# Patient Record
Sex: Male | Born: 1955 | Race: Black or African American | Hispanic: No | Marital: Married | Smoking: Current every day smoker
Health system: Southern US, Community
[De-identification: ages and names within clinical notes are randomized; demographics above are authoritative.]

## PROBLEM LIST (undated history)

## (undated) ENCOUNTER — Ambulatory Visit

## (undated) ENCOUNTER — Ambulatory Visit: Payer: MEDICARE | Attending: Family | Primary: Family

## (undated) ENCOUNTER — Telehealth

## (undated) ENCOUNTER — Encounter: Attending: Family | Primary: Family

## (undated) ENCOUNTER — Encounter

## (undated) ENCOUNTER — Ambulatory Visit: Payer: MEDICARE

## (undated) ENCOUNTER — Telehealth: Attending: Family | Primary: Family

## (undated) ENCOUNTER — Telehealth
Attending: Pharmacist Clinician (PhC)/ Clinical Pharmacy Specialist | Primary: Pharmacist Clinician (PhC)/ Clinical Pharmacy Specialist

## (undated) ENCOUNTER — Ambulatory Visit: Payer: MEDICARE | Attending: Gastroenterology | Primary: Gastroenterology

## (undated) ENCOUNTER — Encounter: Attending: Ophthalmology | Primary: Ophthalmology

## (undated) ENCOUNTER — Ambulatory Visit
Attending: Pharmacist Clinician (PhC)/ Clinical Pharmacy Specialist | Primary: Pharmacist Clinician (PhC)/ Clinical Pharmacy Specialist

## (undated) ENCOUNTER — Encounter: Attending: Gastroenterology | Primary: Gastroenterology

## (undated) ENCOUNTER — Encounter
Attending: Pharmacist Clinician (PhC)/ Clinical Pharmacy Specialist | Primary: Pharmacist Clinician (PhC)/ Clinical Pharmacy Specialist

## (undated) ENCOUNTER — Ambulatory Visit: Payer: MEDICARE | Attending: Ophthalmology | Primary: Ophthalmology

---
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
## 2007-09-05 ENCOUNTER — Emergency Department: Payer: Self-pay | Admitting: Emergency Medicine

## 2011-01-02 ENCOUNTER — Emergency Department: Payer: Self-pay | Admitting: Emergency Medicine

## 2011-12-31 IMAGING — CT CT HEAD WITHOUT CONTRAST
2 series · 16 of 30 positions shown, 20 images · non-contrast
Comparison: none

REASON FOR EXAM: weakness
COMMENTS:

[Series 2: without · axial · non-contrast · 0.39mm/px · z∈[+903,+1028]mm · 13 of 31 slices shown, 17 images]
[im 3/31  brain]
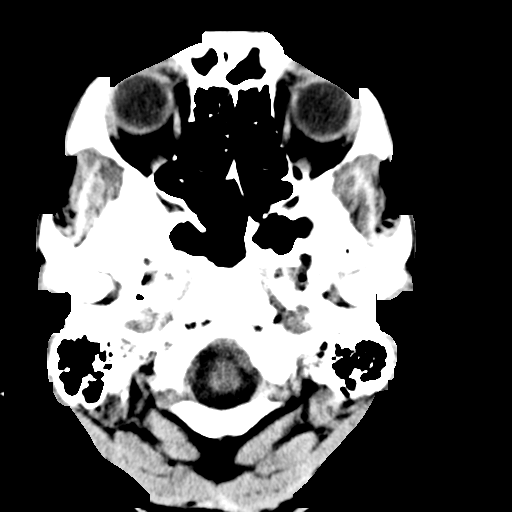
[im 3/31  bone]
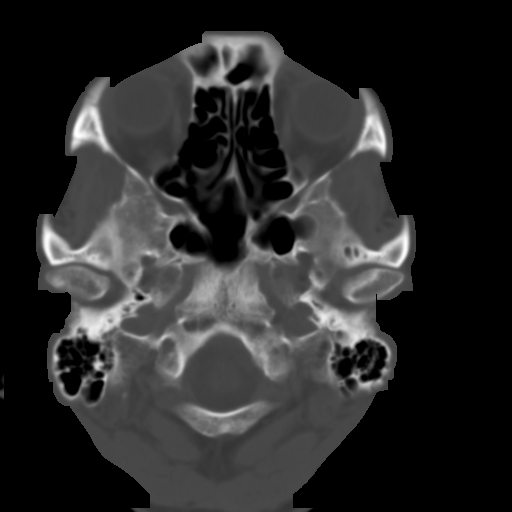
[im 5/31  brain]
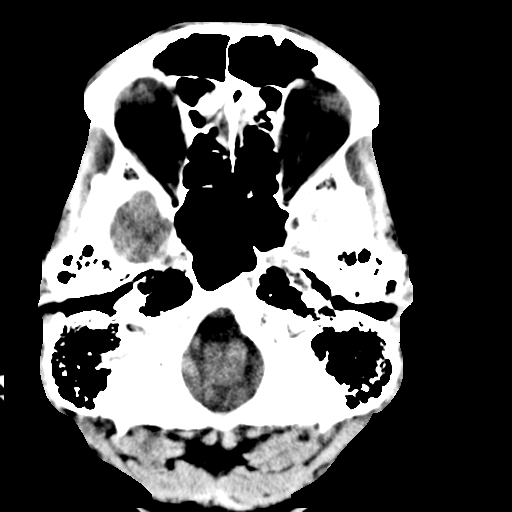
[im 7/31  brain]
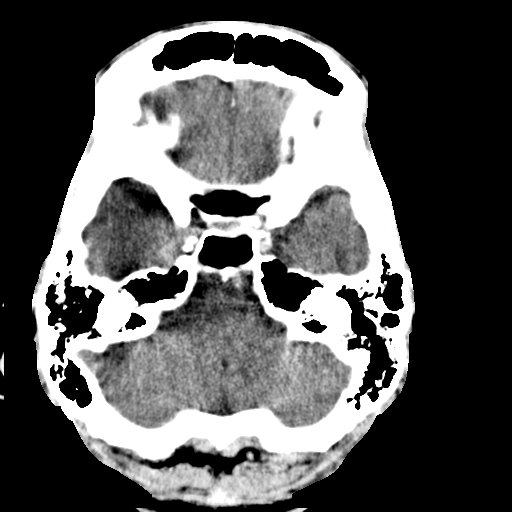
[im 9/31  brain]
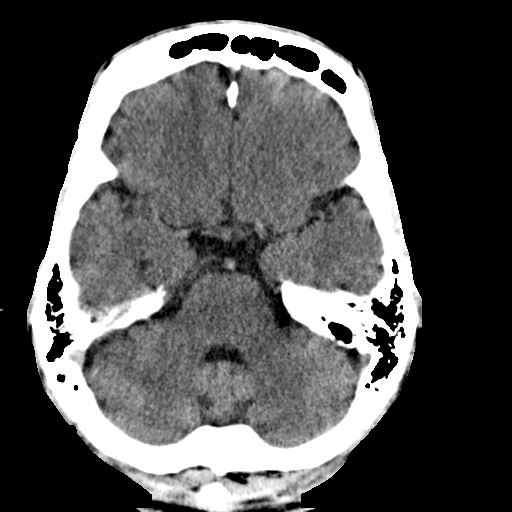
[im 11/31  brain]
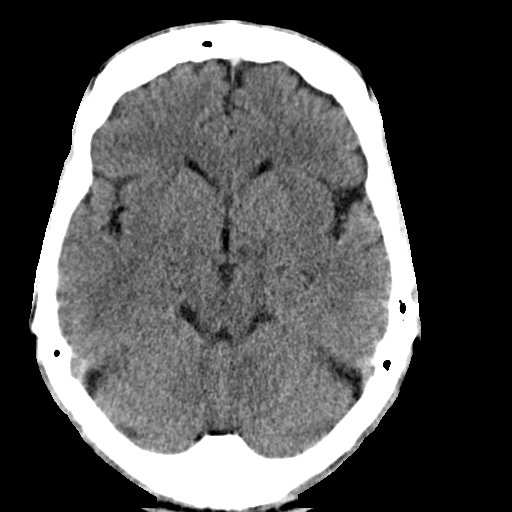
[im 11/31  bone]
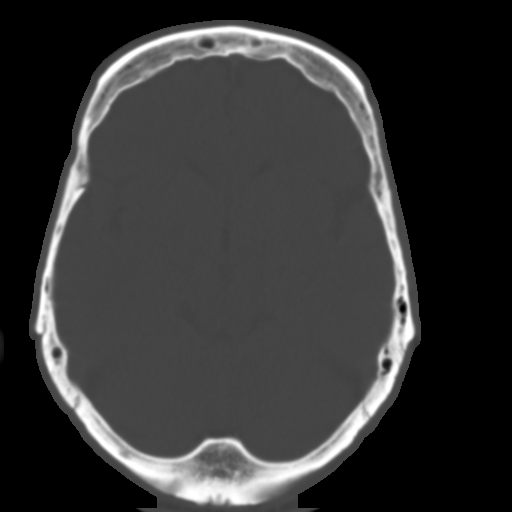
[im 13/31  brain]
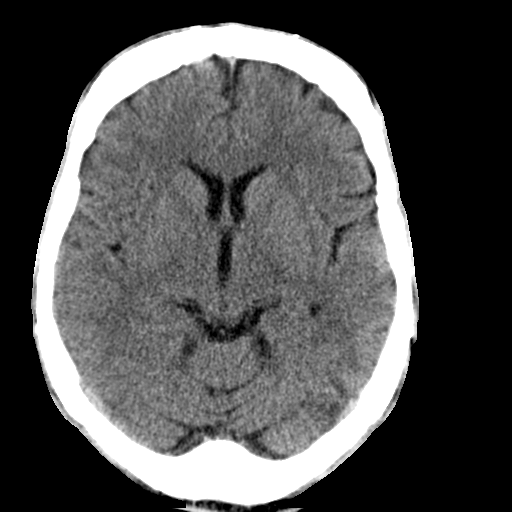
[im 16/31  brain]
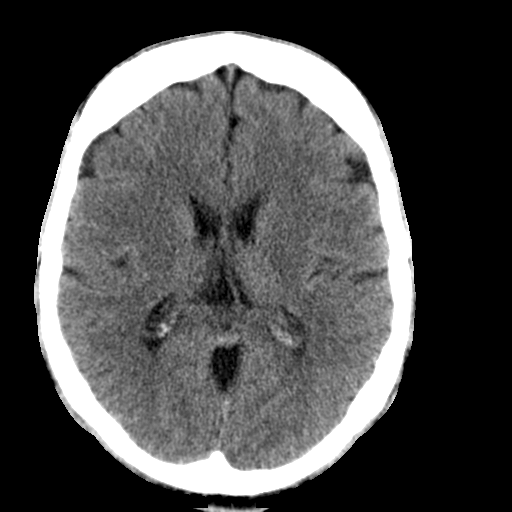
[im 18/31  brain]
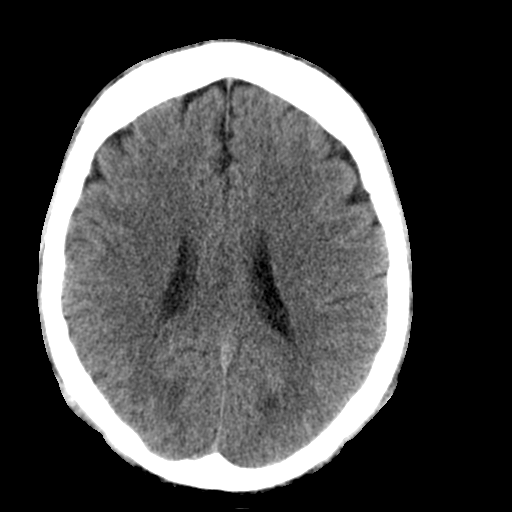
[im 20/31  brain]
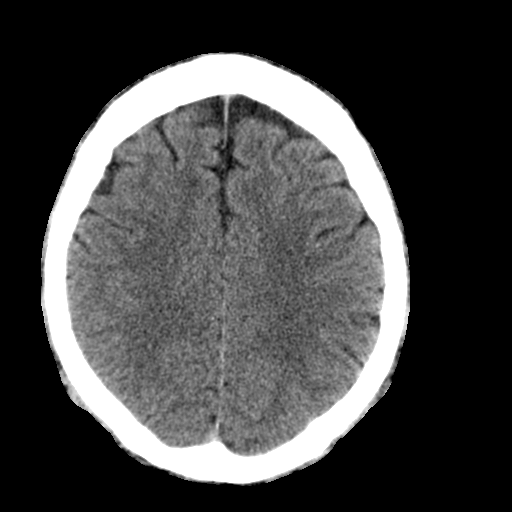
[im 20/31  bone]
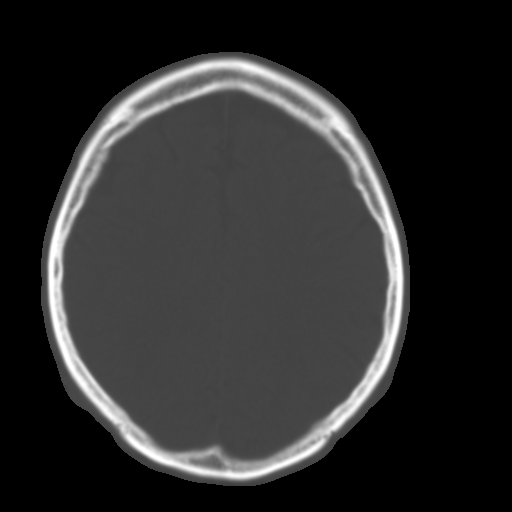
[im 22/31  brain]
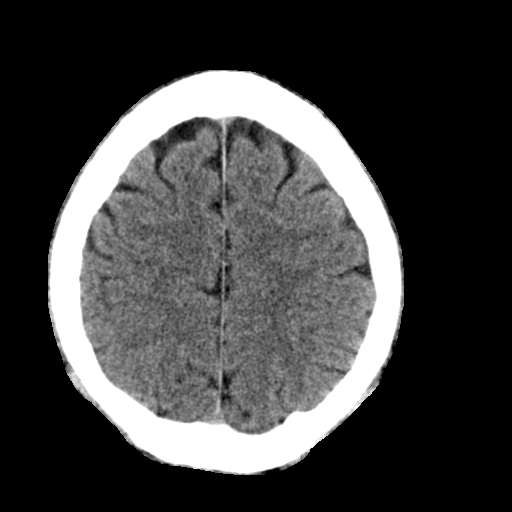
[im 24/31  brain]
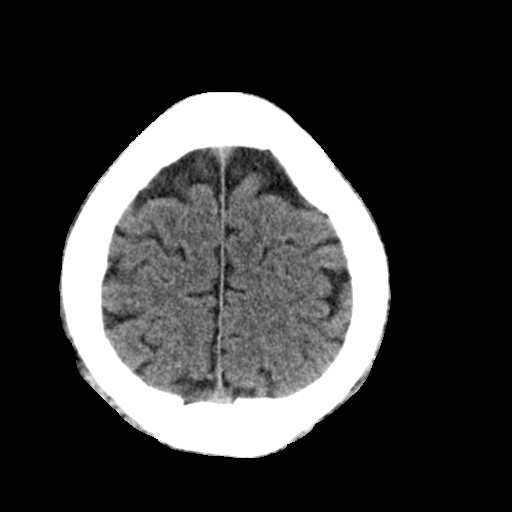
[im 26/31  brain]
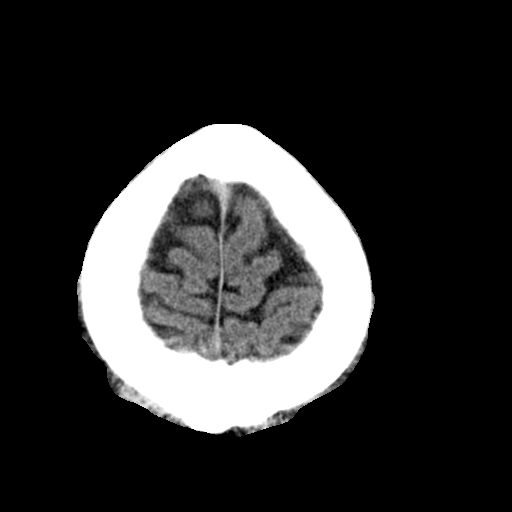
[im 28/31  brain]
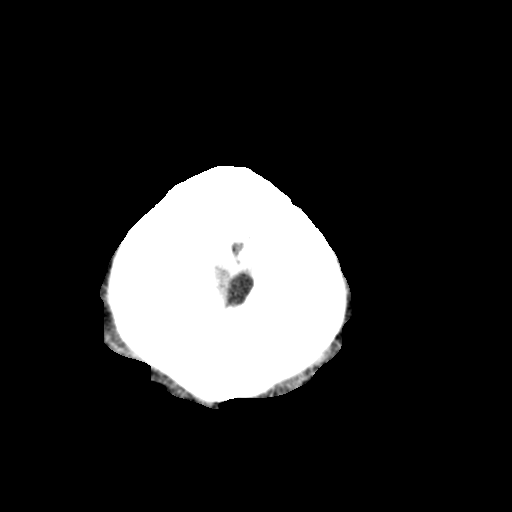
[im 28/31  bone]
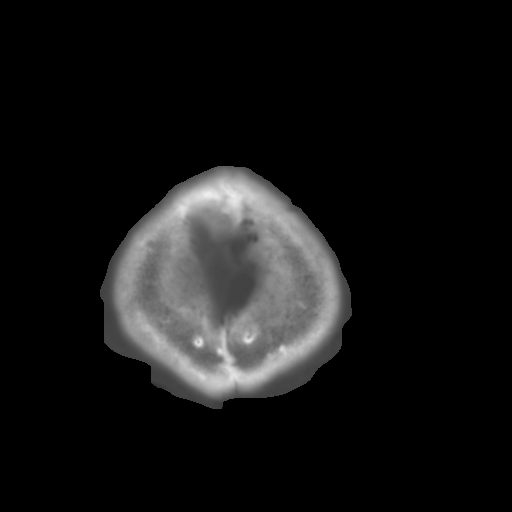

[Series 3: bone · axial · 0.39mm/px · z∈[+903,+943]mm · 3 of 31 slices shown]
[im 3/31  bone]
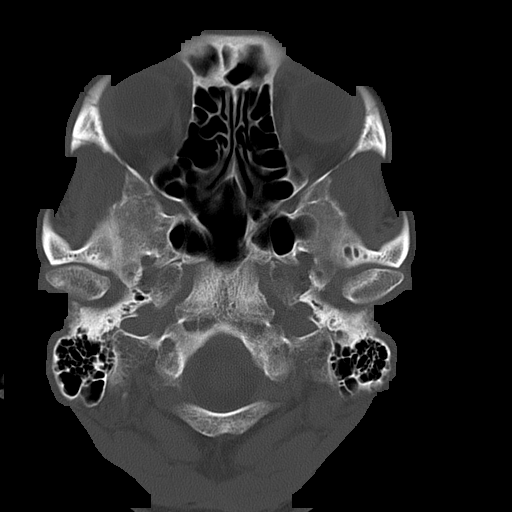
[im 7/31  bone]
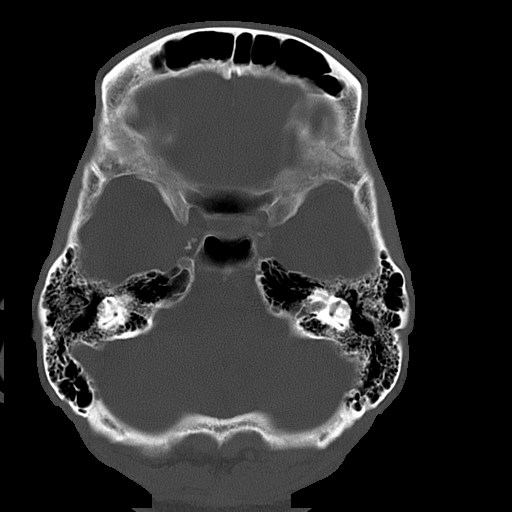
[im 11/31  bone]
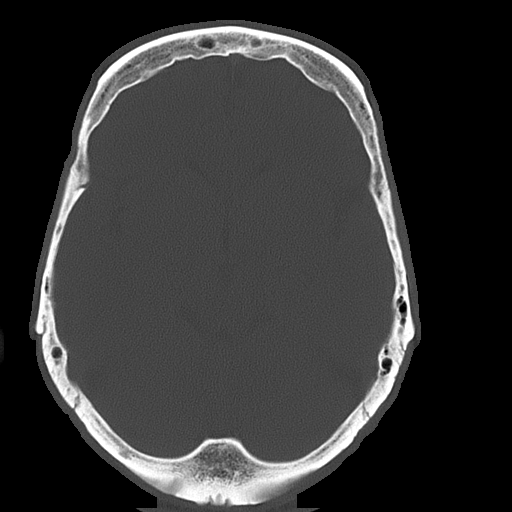

[16 of 30 positions shown; findings below may reference images not displayed]

PROCEDURE:     CT  - CT HEAD WITHOUT CONTRAST  - January 02, 2011  [DATE]

RESULT:     The noncontrast CT of the brain is performed in the standard
fashion. There is no previous exam for comparison.

There is mild atrophy within normal limits for the patient's age. Minimal
low-attenuation is seen within the periventricular white matter which is
nonspecific but most likely secondary to chronic small vessel ischemic
disease to a minimal degree. There is no intracranial hemorrhage, mass or
mass effect. The calvarium appears intact without a depressed skull
fracture. The included sinuses show grossly normal aeration as do the
mastoid air cells.
IMPRESSION: No acute intracranial abnormality. Minimal chronic small
vessel ischemic disease and atrophy.

## 2012-11-20 ENCOUNTER — Ambulatory Visit: Payer: Self-pay | Admitting: Family Medicine

## 2013-11-18 IMAGING — CR DG FOOT COMPLETE 3+V*L*
1 series · 4 of 4 positions shown · non-contrast
Comparison: none

REASON FOR EXAM: r/o fx, infection
COMMENTS:

PROCEDURE:     MDR - MDR FOOT LT COMP W/OBLQUES  - November 20, 2012  [DATE]
RESULT:     Left foot images demonstrate no fracture, dislocation or
radiopaque foreign body. Tarsal and intertarsal degenerative changes and
mild enter phalangeal degenerative changes are present.

[Series 1: ap · 0.17mm/px · 4 of 4 slices shown]
[im 1/4]
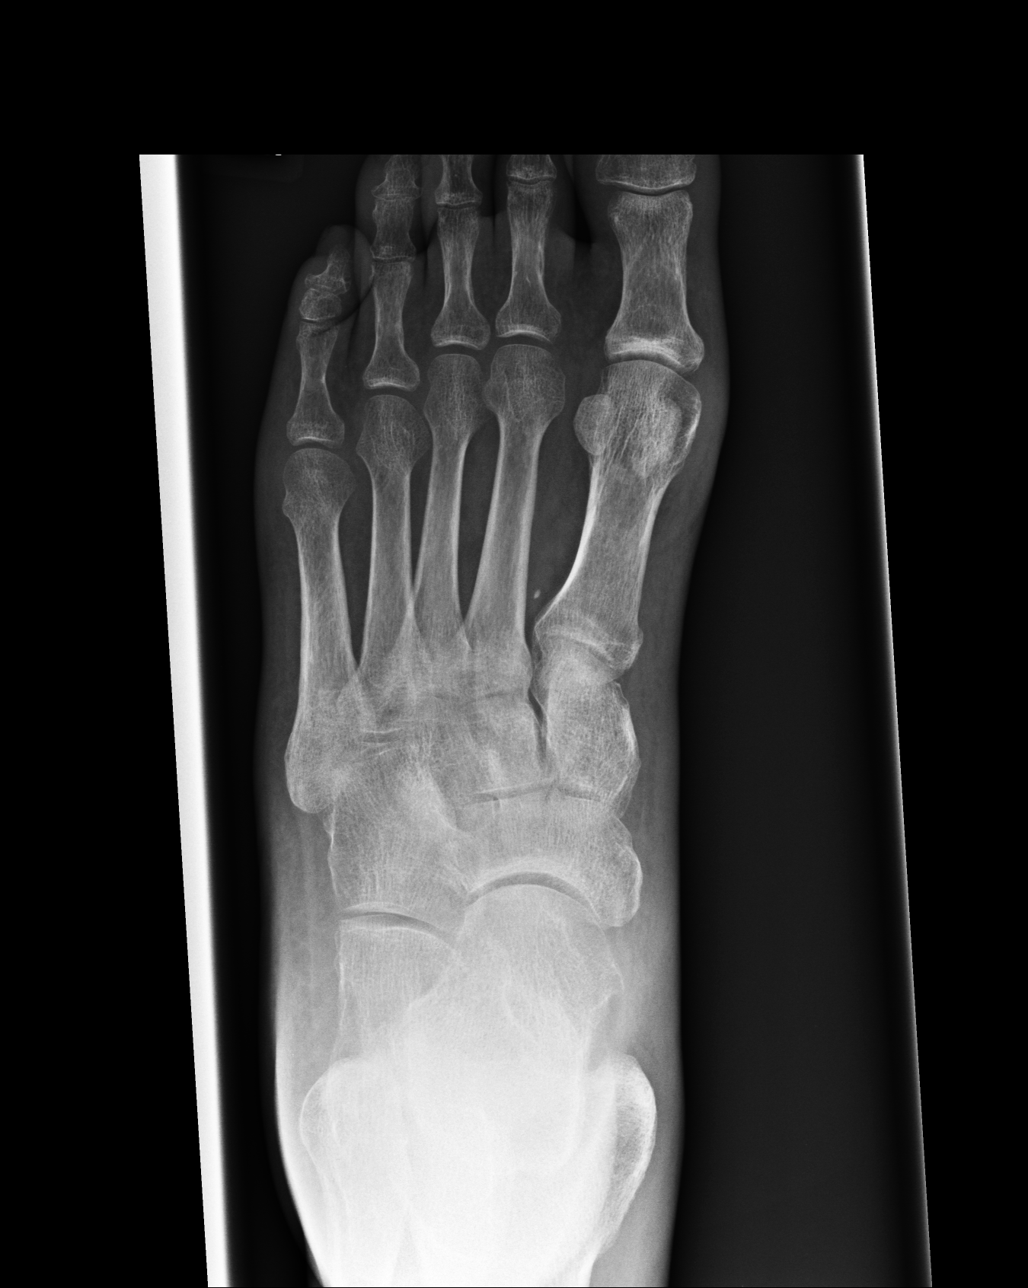
[im 2/4]
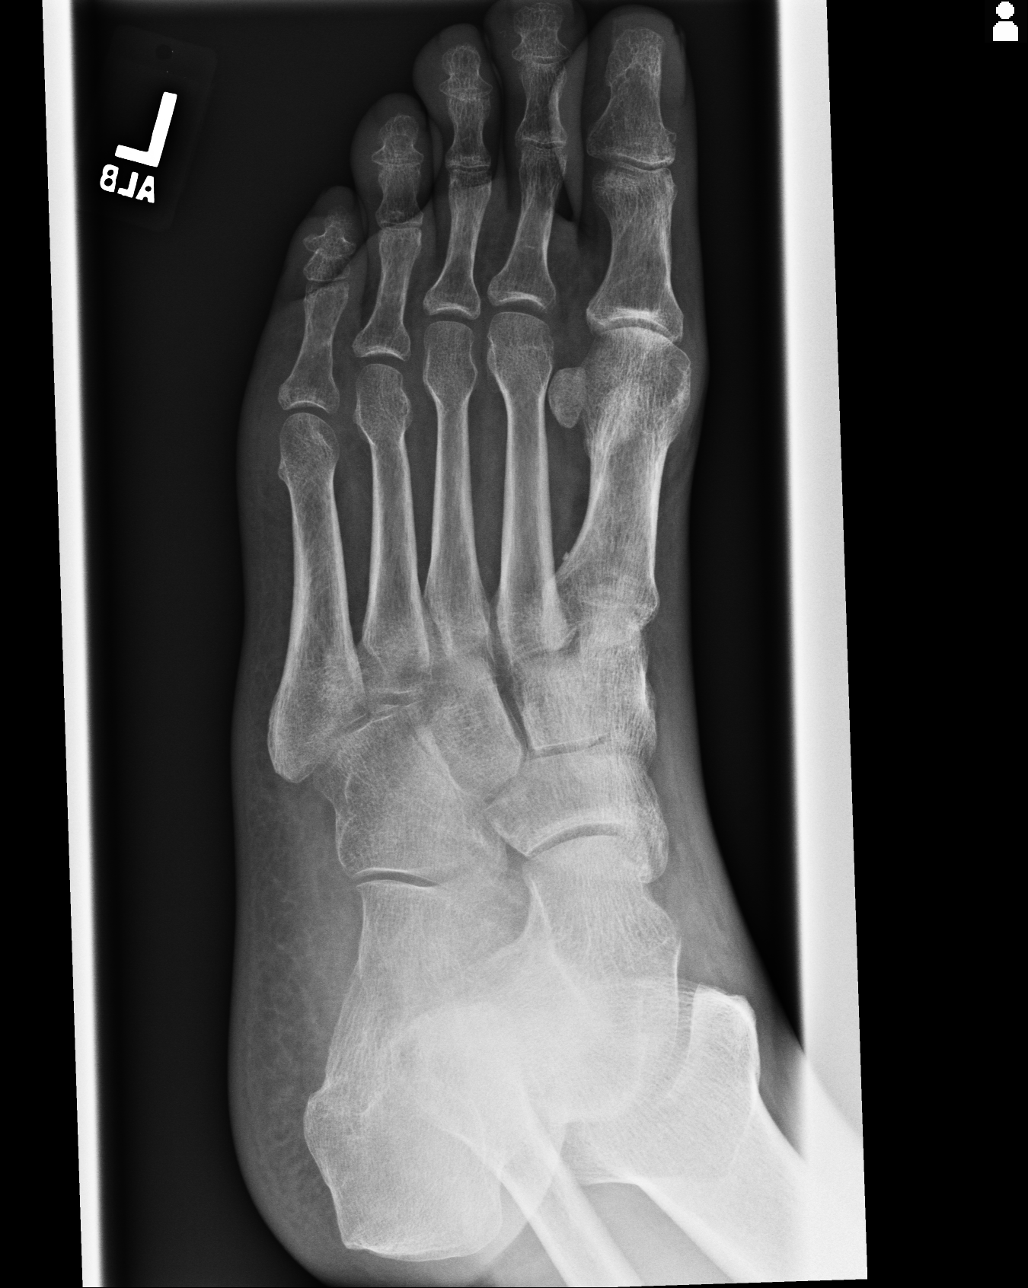
[im 3/4]
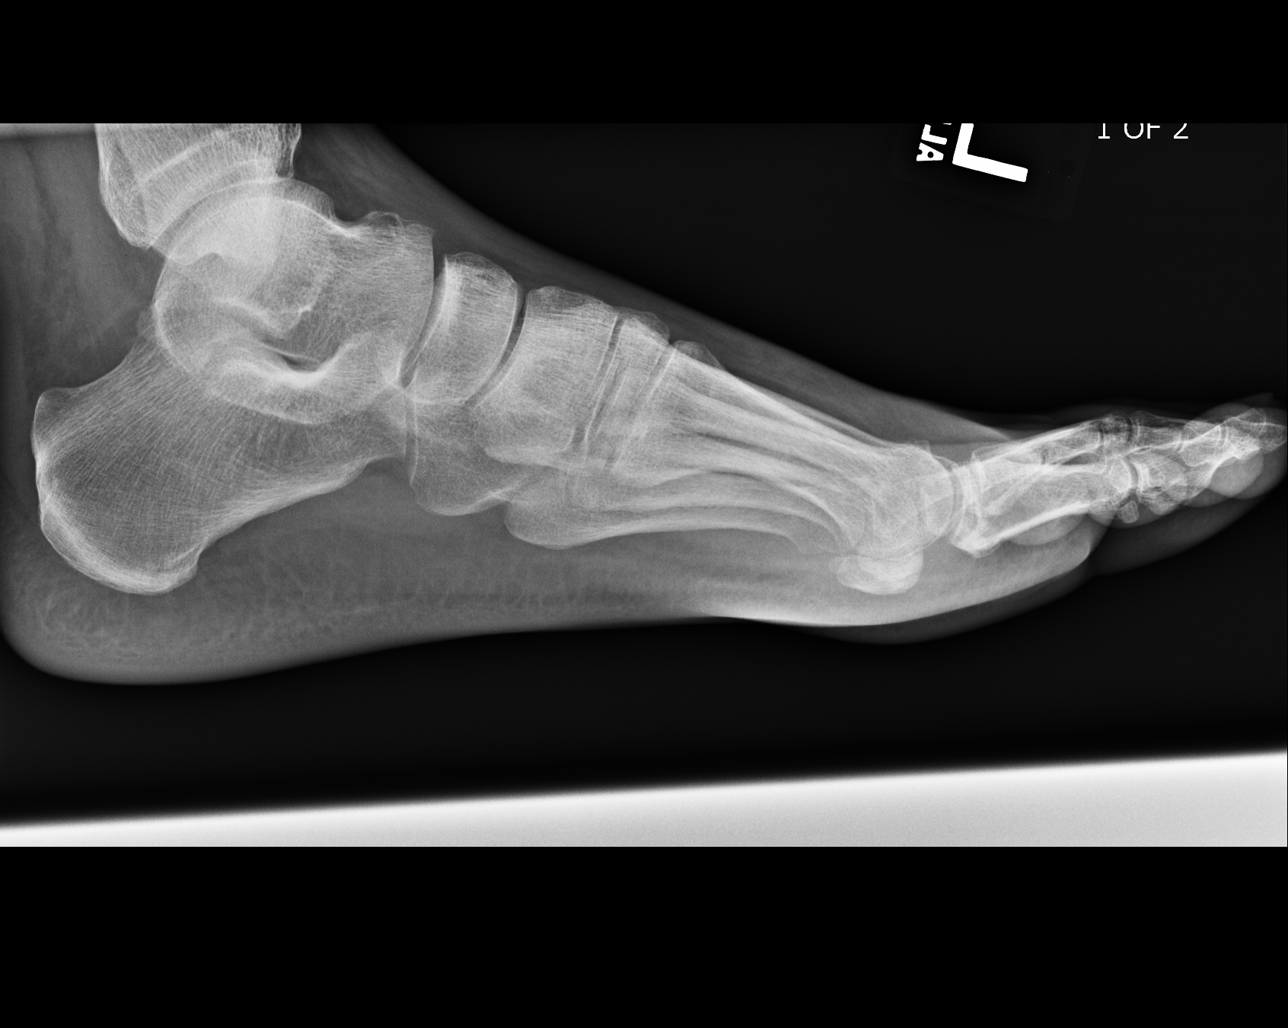
[im 4/4]
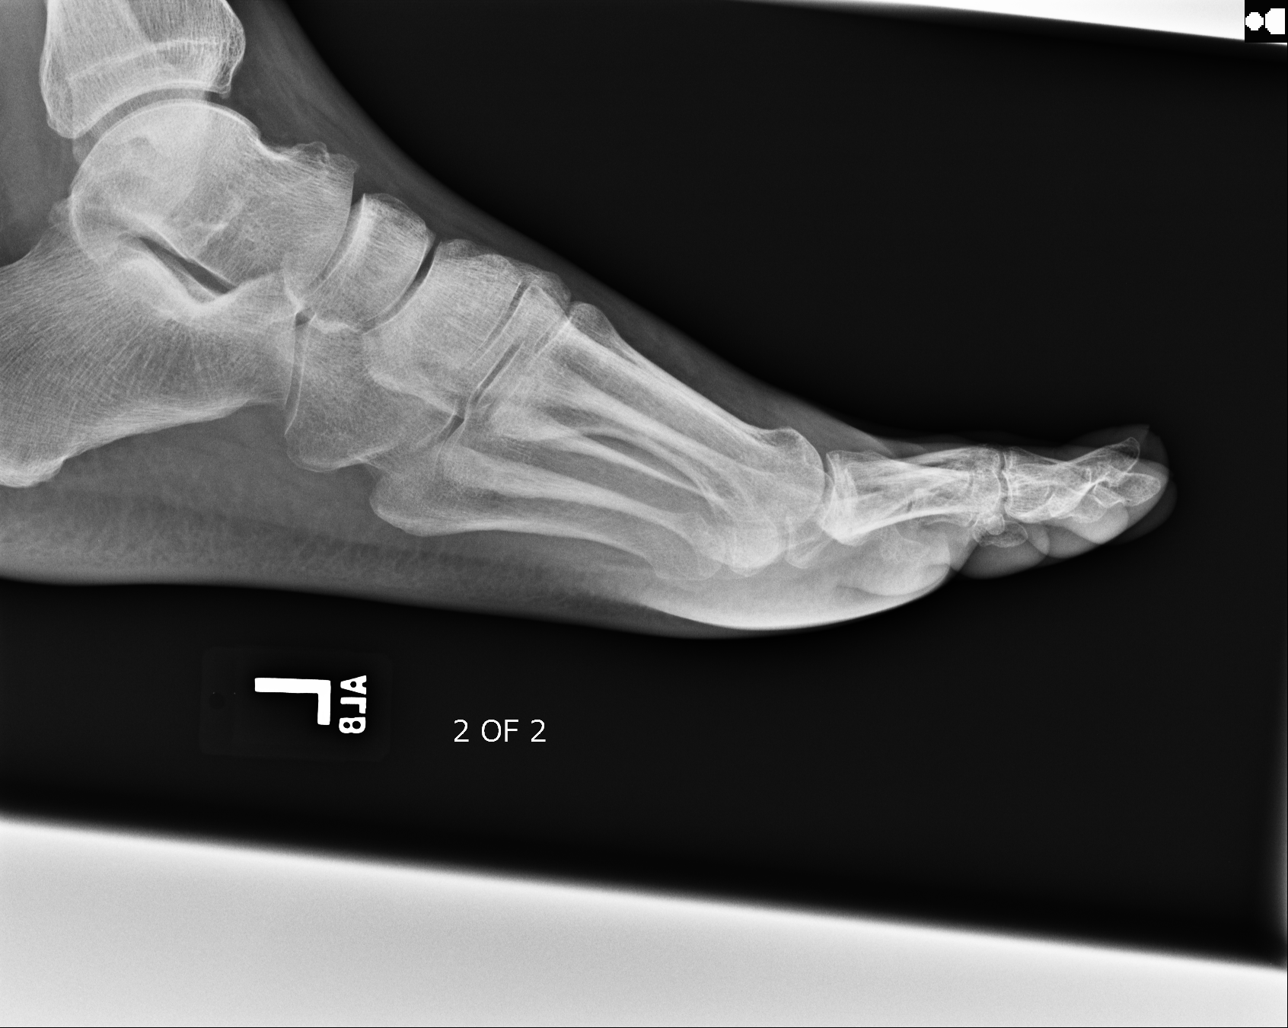

[4 of 4 positions shown; findings below may reference images not displayed]

IMPRESSION: No acute bony abnormality evident. If the patient has
persistent or worsening symptoms further investigation with MRI may be
beneficial.

[REDACTED]

## 2017-03-06 ENCOUNTER — Ambulatory Visit
Admission: RE | Admit: 2017-03-06 | Discharge: 2017-03-06 | Disposition: A | Discharge status: Home / Self Care | Payer: MEDICAID | Attending: Family | Admitting: Family

## 2017-03-06 DIAGNOSIS — G8929 Other chronic pain: Secondary | ICD-10-CM

## 2017-03-06 DIAGNOSIS — E785 Hyperlipidemia, unspecified: Secondary | ICD-10-CM

## 2017-03-06 DIAGNOSIS — Z1211 Encounter for screening for malignant neoplasm of colon: Secondary | ICD-10-CM

## 2017-03-06 DIAGNOSIS — F1410 Cocaine abuse, uncomplicated: Secondary | ICD-10-CM

## 2017-03-06 DIAGNOSIS — E1159 Type 2 diabetes mellitus with other circulatory complications: Principal | ICD-10-CM

## 2017-03-06 DIAGNOSIS — I739 Peripheral vascular disease, unspecified: Secondary | ICD-10-CM

## 2017-03-06 DIAGNOSIS — R2231 Localized swelling, mass and lump, right upper limb: Secondary | ICD-10-CM

## 2017-03-06 MED ORDER — METFORMIN 1,000 MG TABLET
ORAL_TABLET | Freq: Two times a day (BID) | ORAL | 3 refills | 0.00000 days | Status: CP
Start: 2017-03-06 — End: 2017-09-17

## 2017-03-06 MED ORDER — LANCETS
11 refills | 0 days | Status: CP
Start: 2017-03-06 — End: ?

## 2017-03-06 MED ORDER — BLOOD SUGAR DIAGNOSTIC STRIPS
Freq: Two times a day (BID) | 11 refills | 0 days | Status: CP
Start: 2017-03-06 — End: ?

## 2017-03-06 MED ORDER — GLIPIZIDE 5 MG TABLET
ORAL_TABLET | Freq: Every day | ORAL | 3 refills | 0 days | Status: CP
Start: 2017-03-06 — End: 2017-09-17

## 2017-03-06 MED ORDER — SIMVASTATIN 10 MG TABLET
ORAL_TABLET | Freq: Every evening | ORAL | 2 refills | 0 days | Status: CP
Start: 2017-03-06 — End: 2017-09-17

## 2017-03-06 MED ORDER — BLOOD-GLUCOSE METER
Freq: Two times a day (BID) | 0 refills | 0 days | Status: CP
Start: 2017-03-06 — End: 2018-01-27

## 2017-03-27 ENCOUNTER — Ambulatory Visit
Admission: RE | Admit: 2017-03-27 | Discharge: 2017-03-27 | Payer: MEDICARE | Attending: Ophthalmology | Admitting: Ophthalmology

## 2017-03-27 DIAGNOSIS — H40023 Open angle with borderline findings, high risk, bilateral: Secondary | ICD-10-CM

## 2017-03-27 DIAGNOSIS — H524 Presbyopia: Secondary | ICD-10-CM

## 2017-03-27 DIAGNOSIS — E119 Type 2 diabetes mellitus without complications: Secondary | ICD-10-CM

## 2017-03-27 DIAGNOSIS — E1159 Type 2 diabetes mellitus with other circulatory complications: Principal | ICD-10-CM

## 2017-03-27 DIAGNOSIS — H2513 Age-related nuclear cataract, bilateral: Secondary | ICD-10-CM

## 2017-04-01 ENCOUNTER — Ambulatory Visit (INDEPENDENT_AMBULATORY_CARE_PROVIDER_SITE_OTHER): Payer: Medicare Other | Admitting: Podiatry

## 2017-04-01 ENCOUNTER — Encounter: Payer: Self-pay | Admitting: Podiatry

## 2017-04-01 DIAGNOSIS — S88912S Complete traumatic amputation of left lower leg, level unspecified, sequela: Secondary | ICD-10-CM

## 2017-04-01 DIAGNOSIS — M79674 Pain in right toe(s): Secondary | ICD-10-CM

## 2017-04-01 DIAGNOSIS — E119 Type 2 diabetes mellitus without complications: Secondary | ICD-10-CM | POA: Diagnosis not present

## 2017-04-01 DIAGNOSIS — B351 Tinea unguium: Secondary | ICD-10-CM

## 2017-04-01 NOTE — Progress Notes (Signed)
Complaint:  Visit Type: Patient returns to my office for continued preventative foot care services. Complaint: Patient states" my nails have grown long and thick and become painful to walk and wear shoes" Patient has been diagnosed with DM with no foot complications.  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  The patient presents for preventative foot care services. No changes to ROS  Podiatric Exam: Vascular: dorsalis pedis and posterior tibial pulses are palpable right . Capillary return is immediate right foot.  Temperature gradient is WNL. Skin turgor WNL  Sensorium: Normal Semmes Weinstein monofilament test. Normal tactile sensation bilaterally. Nail Exam: Pt has thick disfigured discolored nails with subungual debris noted right  entire nail hallux through fifth toenails Ulcer Exam: There is no evidence of ulcer or pre-ulcerative changes or infection. Orthopedic Exam: Muscle tone and strength are WNL. No limitations in general ROM. No crepitus or effusions noted. Foot type and digits show no abnormalities. Bony prominences are unremarkable. Skin: No Porokeratosis. No infection or ulcers  Diagnosis:  Onychomycosis, , Pain in right toe, pain in left toes Amputation left leg.  Diabetes with no complications right feet.  Treatment & Plan Procedures and Treatment: Consent by patient was obtained for treatment procedures. The patient understood the discussion of treatment and procedures well. All questions were answered thoroughly reviewed. Debridement of mycotic and hypertrophic toenails, 1 through 5 bilateral and clearing of subungual debris. No ulceration, no infection noted.  Return Visit-Office Procedure: Patient instructed to return to the office for a follow up visit 3 months for continued evaluation and treatment.    Helane GuntherGregory Moira Umholtz DPM

## 2017-04-08 ENCOUNTER — Ambulatory Visit: Admission: RE | Admit: 2017-04-08 | Discharge: 2017-04-08 | Payer: MEDICARE | Attending: Dermatology

## 2017-04-08 DIAGNOSIS — D489 Neoplasm of uncertain behavior, unspecified: Secondary | ICD-10-CM

## 2017-04-08 DIAGNOSIS — R2231 Localized swelling, mass and lump, right upper limb: Principal | ICD-10-CM

## 2017-04-10 ENCOUNTER — Ambulatory Visit
Admission: RE | Admit: 2017-04-10 | Discharge: 2017-04-10 | Payer: MEDICARE | Attending: Ophthalmology | Admitting: Ophthalmology

## 2017-04-10 DIAGNOSIS — H40023 Open angle with borderline findings, high risk, bilateral: Principal | ICD-10-CM

## 2017-04-10 DIAGNOSIS — H401132 Primary open-angle glaucoma, bilateral, moderate stage: Secondary | ICD-10-CM

## 2017-04-10 MED ORDER — LATANOPROST 0.005 % EYE DROPS
Freq: Every evening | OPHTHALMIC | 11 refills | 0 days | Status: CP
Start: 2017-04-10 — End: 2018-01-27

## 2017-07-01 ENCOUNTER — Ambulatory Visit: Payer: Medicare Other | Admitting: Podiatry

## 2017-09-17 ENCOUNTER — Ambulatory Visit: Admit: 2017-09-17 | Discharge: 2017-09-18 | Payer: MEDICARE | Attending: Family | Primary: Family

## 2017-09-17 DIAGNOSIS — E119 Type 2 diabetes mellitus without complications: Principal | ICD-10-CM

## 2017-09-17 DIAGNOSIS — E1159 Type 2 diabetes mellitus with other circulatory complications: Secondary | ICD-10-CM

## 2017-09-17 DIAGNOSIS — Z1159 Encounter for screening for other viral diseases: Secondary | ICD-10-CM

## 2017-09-17 DIAGNOSIS — I739 Peripheral vascular disease, unspecified: Secondary | ICD-10-CM

## 2017-09-17 DIAGNOSIS — E785 Hyperlipidemia, unspecified: Secondary | ICD-10-CM

## 2017-09-17 DIAGNOSIS — Z23 Encounter for immunization: Secondary | ICD-10-CM

## 2017-09-17 MED ORDER — SIMVASTATIN 10 MG TABLET
ORAL_TABLET | Freq: Every evening | ORAL | 3 refills | 0.00000 days | Status: CP
Start: 2017-09-17 — End: 2018-10-16

## 2017-09-17 MED ORDER — METFORMIN 1,000 MG TABLET
ORAL_TABLET | Freq: Two times a day (BID) | ORAL | 3 refills | 0 days | Status: CP
Start: 2017-09-17 — End: 2018-10-16

## 2017-09-17 MED ORDER — GLIPIZIDE 5 MG TABLET
ORAL_TABLET | Freq: Every day | ORAL | 3 refills | 0 days | Status: CP
Start: 2017-09-17 — End: 2018-10-16

## 2017-09-17 MED ORDER — CLOPIDOGREL 75 MG TABLET
ORAL_TABLET | Freq: Every day | ORAL | 3 refills | 0.00000 days | Status: CP
Start: 2017-09-17 — End: 2017-12-17

## 2017-12-17 ENCOUNTER — Ambulatory Visit: Admit: 2017-12-17 | Discharge: 2017-12-18 | Payer: MEDICARE | Attending: Family | Primary: Family

## 2017-12-17 DIAGNOSIS — I739 Peripheral vascular disease, unspecified: Secondary | ICD-10-CM

## 2017-12-17 DIAGNOSIS — E119 Type 2 diabetes mellitus without complications: Principal | ICD-10-CM

## 2017-12-17 DIAGNOSIS — R768 Other specified abnormal immunological findings in serum: Secondary | ICD-10-CM

## 2017-12-17 DIAGNOSIS — Z1211 Encounter for screening for malignant neoplasm of colon: Secondary | ICD-10-CM

## 2017-12-17 MED ORDER — CLOPIDOGREL 75 MG TABLET
ORAL_TABLET | Freq: Every day | ORAL | 3 refills | 0 days | Status: CP
Start: 2017-12-17 — End: 2018-10-16

## 2017-12-25 ENCOUNTER — Ambulatory Visit: Admit: 2017-12-25 | Discharge: 2017-12-26 | Payer: MEDICARE | Attending: Gastroenterology | Primary: Gastroenterology

## 2017-12-25 DIAGNOSIS — R634 Abnormal weight loss: Secondary | ICD-10-CM

## 2017-12-25 DIAGNOSIS — R768 Other specified abnormal immunological findings in serum: Principal | ICD-10-CM

## 2017-12-25 DIAGNOSIS — K732 Chronic active hepatitis, not elsewhere classified: Secondary | ICD-10-CM

## 2017-12-25 DIAGNOSIS — Z1159 Encounter for screening for other viral diseases: Secondary | ICD-10-CM

## 2017-12-25 DIAGNOSIS — K769 Liver disease, unspecified: Secondary | ICD-10-CM

## 2017-12-25 LAB — COMPREHENSIVE METABOLIC PANEL
ALBUMIN: 4.5 g/dL (ref 3.5–5.0)
ALKALINE PHOSPHATASE: 95 U/L (ref 38–126)
ALT (SGPT): 39 U/L (ref 19–72)
ANION GAP: 12 mmol/L (ref 9–15)
AST (SGOT): 38 U/L (ref 19–55)
BILIRUBIN TOTAL: 0.7 mg/dL (ref 0.0–1.2)
BLOOD UREA NITROGEN: 15 mg/dL (ref 7–21)
BUN / CREAT RATIO: 21
CALCIUM: 9.4 mg/dL (ref 8.5–10.2)
CHLORIDE: 105 mmol/L (ref 98–107)
CO2: 29 mmol/L (ref 22.0–30.0)
CREATININE: 0.7 mg/dL (ref 0.70–1.30)
EGFR MDRD AF AMER: 139 mL/min/{1.73_m2} (ref >=60)
EGFR MDRD NON AF AMER: 115 mL/min/{1.73_m2} (ref >=60)
GLUCOSE RANDOM: 105 mg/dL (ref 65–179)
POTASSIUM: 4.7 mmol/L (ref 3.5–5.0)
PROTEIN TOTAL: 8.1 g/dL (ref 6.5–8.3)

## 2017-12-25 LAB — CBC W/ AUTO DIFF
BASOPHILS ABSOLUTE COUNT: 0 10*9/L (ref 0.0–0.1)
EOSINOPHILS ABSOLUTE COUNT: 0.2 10*9/L (ref 0.0–0.4)
EOSINOPHILS RELATIVE PERCENT: 4.4 %
HEMATOCRIT: 44.3 % (ref 41.0–53.0)
HEMOGLOBIN: 14.4 g/dL (ref 13.5–17.5)
LARGE UNSTAINED CELLS: 2 % (ref 0–4)
LYMPHOCYTES ABSOLUTE COUNT: 1.4 10*9/L — ABNORMAL LOW (ref 1.5–5.0)
LYMPHOCYTES RELATIVE PERCENT: 34.9 %
MEAN CORPUSCULAR HEMOGLOBIN CONC: 32.6 g/dL (ref 31.0–37.0)
MEAN CORPUSCULAR VOLUME: 90.3 fL (ref 80.0–100.0)
MEAN PLATELET VOLUME: 10.6 fL — ABNORMAL HIGH (ref 7.0–10.0)
MONOCYTES ABSOLUTE COUNT: 0.3 10*9/L (ref 0.2–0.8)
MONOCYTES RELATIVE PERCENT: 7.6 %
NEUTROPHILS ABSOLUTE COUNT: 2 10*9/L (ref 2.0–7.5)
NEUTROPHILS RELATIVE PERCENT: 50.1 %
PLATELET COUNT: 145 10*9/L — ABNORMAL LOW (ref 150–440)
RED BLOOD CELL COUNT: 4.9 10*12/L (ref 4.50–5.90)
RED CELL DISTRIBUTION WIDTH: 13.7 % (ref 12.0–15.0)
WBC ADJUSTED: 4 10*9/L — ABNORMAL LOW (ref 4.5–11.0)

## 2017-12-25 LAB — EGFR MDRD NON AF AMER: Glomerular filtration rate/1.73 sq M.predicted.non black:ArVRat:Pt:Ser/Plas/Bld:Qn:Creatinine-based formula (MDRD): 115

## 2017-12-25 LAB — PROTIME: Lab: 12.6

## 2017-12-25 LAB — PROTIME-INR: INR: 1.11

## 2017-12-25 LAB — HEMOGLOBIN: Lab: 14.4

## 2017-12-25 LAB — THYROID STIMULATING HORMONE: Thyrotropin:ACnc:Pt:Ser/Plas:Qn:: 0.691

## 2017-12-25 LAB — AFP-TUMOR MARKER: Alpha-1-Fetoprotein.tumor marker:MCnc:Pt:Ser/Plas:Qn:: 6.48

## 2017-12-25 NOTE — Unmapped (Signed)
Counseling for HCV treatment     B18.2 Hep C: no    K74.60 Cirrhosis: pending Fibroscan,   Child Pugh Score if applicable and for Medicaid pts: n/a  Z94.4 Liver Transplant: no    Genotype: 1b (09/17/17)  HCV RNA: 161,096 IU/ml on 09/17/17   Fibrosis score: pending Fibroscan on 12/25/17  HIV Co-infection? no  Signs of liver decompensation? no  Previous treatment? naive    Planned regimen: Harvoni (ledipasvir/sofosbuvir 90/400mg ) x 12 weeks  Reason for 12 weeks: African American  Urgency: Routine Request    Prescribing Provider/NPI: Dr. Gavin Potters / [id number]  Signature waiver form not obtained at this time.  Insurance: Medicare Part D, [employer]    Randy Bray is 62 y.o. African American male who presents to clinic daughter, Arlina Robes and is interested in starting treatment with Harvoni. We discussed the prior authorization (PA) process of obtaining the medication through insurance and that this may take some time.      Current medications:  Current Outpatient Medications   Medication Sig Dispense Refill   ??? acetaminophen (TYLENOL) 325 MG tablet Take 650MG  PO every 6 hours as needed for pain. Do not exceed 3G daily 30 tablet 0   ??? aspirin (ADULT LOW DOSE ASPIRIN) 81 MG tablet Take 81 mg by mouth daily.      ??? blood sugar diagnostic Strp by Other route Two (2) times a day. 100 each 11   ??? glipiZIDE (GLUCOTROL) 5 MG tablet Take 1 tablet (5 mg total) by mouth once daily. 90 tablet 3   ??? lancets Misc Use lancet for checking blood sugar two times a day 100 each 11   ??? latanoprost (XALATAN) 0.005 % ophthalmic solution Administer 1 drop to both eyes nightly. 2.5 mL 11   ??? metFORMIN (GLUCOPHAGE) 1000 MG tablet Take 1 tablet (1,000 mg total) by mouth 2 (two) times a day with meals. 180 tablet 3   ??? simvastatin (ZOCOR) 10 MG tablet Take 1 tablet (10 mg total) by mouth nightly. 90 tablet 3   ??? blood-glucose meter Misc 1 each by Miscellaneous route Two (2) times a day. for 1 day 1 each 0   ??? clopidogrel (PLAVIX) 75 mg tablet Take 1 tablet (75 mg total) by mouth daily. 90 tablet 3   ??? oxyCODONE (ROXICODONE) 5 MG immediate release tablet Take 1-2 tabs every 4 hours for pain as needed. Do not drive while taking this medication (Patient not taking: Reported on 12/25/2017) 180 tablet 0     No current facility-administered medications for this visit.        Following topics were discussed during counseling:    1. Indications for medication, dosage and administration.     A. Harvoni 90/400mg  1 tablet to take daily with or without food.    2. Common side effects of medications and management strategies. (fatigue, headache)    3. Importance of adherence to regimen, follow-up clinic visits and lab monitoring.     A. Asked patient to call [facility] [phone]  to establish start date for treatment and to schedule appointment 4 weeks before starting treatment.    4. Drug-drug interaction.    A. Current medications have been reviewed and assessed for possible interaction.      - Simvastatin: Advised pt of potential increased levels of simvastatin if taken with Harvoni. Advised pt to monitor for potential increased side effects from statin (i.e. Muscle ache, dark urine etc) and to notify PCP of these adverse events.  B. We discussed the mechanism of drug-drug interaction with acid lowering agents. Advised to check with MD or pharmacist before taking any OTC/herbal medications, with emphasis regarding indigestion/heartburn medications.  Denies use of herbal medication such as milk thistle or St. John's wart.  Allergies have been verified.    5. Importance of informing pharmacy and clinic of updated contact information.   Discussed the process of obtaining medication through specialty pharmacy and when approved medication will be delivered to patient's home. Stressed importance of being able to be reached over the phone.    Patient verbalized understanding. Provided contact information for any questions/concerns.       Park Breed, Pharm D., BCPS, BCGP, CPP  [facility]  [address]  [place], [place] [address]  [phone]    This portion of the visit was 15 minutes in duration and greater than 50% was spend in direct counseling and coordination of care regarding hepatitis C medication management.

## 2017-12-25 NOTE — Unmapped (Addendum)
It was great to meet you.    We will do lab work today and get a fibroscan to assess liver scarring. You will need an outpatient MRI.     I have ordered an upper endoscopy and colonoscopy. We should call you to schedule them. If you do not hear from [place] in 5 business days, call [phone] to schedule them.

## 2017-12-25 NOTE — Unmapped (Signed)
Opened in error

## 2017-12-25 NOTE — Unmapped (Signed)
[facility]      REFERRING PROVIDER:  Loran Senters, FNP  [address]  [place], [place] [address]    PRIMARY CARE PROVIDER:  Noralyn Pick, FNP      PATIENT PROFILE:        Randy Bray is a 62 y.o. male (DOB: 1955/11/22) who is seen in consultation at the request of Dr. Ninetta Lights for Hepatitis C, genotype 1b        CHIEF COMPLAINT: Hepatitis C    HISTORY OF PRESENT ILLNESS: This is a 62 y.o. male with a PMH notable for PAD s/p Left AKA, DM, HLD, polysubstance abuse, and hepatitis C who presents for hepatitis C. He was noted to have elevated transaminases in 2018 and was found to have hepatitis C, genotype 1B. Before this diagnosis, he had never been told he has abnormal LFTs or any liver issues. He thinks he had jaundice several decades ago. No issues with ascites, confusion, or GI bleeding. He denies any family history of liver disease. He drinks 24-48 ounces of beer daily. He drank more heavily in the past and has had 2 DUIs. He smokes cigarettes and does cocaine occasionally.   On review of systems, he also reports 40-50lb unexplained weight loss over the past 5 or 6 years (some but not all of this is attributable to left AKA). He has never had an EGD or colonoscopy.     Pertinent data:  - Jan 2019 lab work: Hep C genotype 1B, RNA 409,811 with log 5.68  - July 2018 lab work: Cr 0.6; Bili 0.8, AST 41, ALT 54, Alk P 81        REVIEW OF SYSTEMS:     The balance of 12 systems reviewed is negative except as noted in the HPI.       PAST MEDICAL HISTORY:    Past Medical History:   Diagnosis Date   ??? Diabetes (CMS-HCC) 11/27/2012    Type II   ??? 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 Neuropathic pain, leg 08/04/2013   ??? PAD (peripheral artery disease) (CMS-HCC)    ??? Tobacco abuse        PAST SURGICAL HISTORY:    Past Surgical History:   Procedure Laterality Date   ??? ANGIOPLASTY FEM POP UNIL [facility] HISTORICAL RESULT)     ??? Left SFA to PT bypass with PTFE  02/2012 ??? PILONIDAL CYST / SINUS EXCISION     ??? PR AMPUTATE THIGH,THRU FEMUR Left 01/29/2013    Procedure: POSS. L. AKA;  Surgeon: Marion Downer, MD;  Location: MAIN OR [facility];  Service: Vascular   ??? PR AMPUTATION LOW LEG THRU TIB/FIB Left 11/28/2012    Procedure: AMPUTA LEG THRU TIBIA & FIBULA;  Surgeon: Marion Downer, MD;  Location: MAIN OR [facility];  Service: Vascular   ??? PR DEBRIDEMENT, SKIN, SUB-Q TISSUE,MUSCLE,BONE,=<20 SQ CM Left 01/29/2013    Procedure: DEBRIDEMENT; SKIN, SUBCUTANEOUS TISSUE, MUSCLE, & BONE;  Surgeon: Marion Downer, MD;  Location: MAIN OR [facility];  Service: Vascular   ??? PR RE-AMPUTATION LOWER LEG Left 01/29/2013    Procedure: REVISION OF L. BKA;  Surgeon: Marion Downer, MD;  Location: MAIN OR [facility];  Service: Vascular       MEDICATIONS:      Current Outpatient Medications:   ???  acetaminophen (TYLENOL) 325 MG tablet, Take 650MG  PO every 6 hours as needed for pain. Do not exceed 3G daily, Disp: 30 tablet, Rfl:  0  ???  aspirin (ADULT LOW DOSE ASPIRIN) 81 MG tablet, Take 81 mg by mouth daily. , Disp: , Rfl:   ???  blood sugar diagnostic Strp, by Other route Two (2) times a day., Disp: 100 each, Rfl: 11  ???  glipiZIDE (GLUCOTROL) 5 MG tablet, Take 1 tablet (5 mg total) by mouth once daily., Disp: 90 tablet, Rfl: 3  ???  lancets Misc, Use lancet for checking blood sugar two times a day, Disp: 100 each, Rfl: 11  ???  latanoprost (XALATAN) 0.005 % ophthalmic solution, Administer 1 drop to both eyes nightly., Disp: 2.5 mL, Rfl: 11  ???  metFORMIN (GLUCOPHAGE) 1000 MG tablet, Take 1 tablet (1,000 mg total) by mouth 2 (two) times a day with meals., Disp: 180 tablet, Rfl: 3  ???  simvastatin (ZOCOR) 10 MG tablet, Take 1 tablet (10 mg total) by mouth nightly., Disp: 90 tablet, Rfl: 3  ???  blood-glucose meter Misc, 1 each by Miscellaneous route Two (2) times a day. for 1 day, Disp: 1 each, Rfl: 0  ???  clopidogrel (PLAVIX) 75 mg tablet, Take 1 tablet (75 mg total) by mouth daily., Disp: 90 tablet, Rfl: 3  ??? oxyCODONE (ROXICODONE) 5 MG immediate release tablet, Take 1-2 tabs every 4 hours for pain as needed. Do not drive while taking this medication (Patient not taking: Reported on 12/25/2017), Disp: 180 tablet, Rfl: 0    (Not in a hospital admission)    ALLERGIES:    Patient has no known allergies.    SOCIAL HISTORY  Social History     Socioeconomic History   ??? Marital status: Divorced     Spouse name: None   ??? Number of children: None   ??? Years of education: None   ??? Highest education level: None   Occupational History   ??? Occupation: Curator   Social Needs   ??? Financial resource strain: None   ??? Food insecurity:     Worry: None     Inability: None   ??? Transportation needs:     Medical: None     Non-medical: None   Tobacco Use   ??? Smoking status: Current Some Day Smoker     Packs/day: 0.25     Years: 40.00     Pack years: 10.00     Types: Cigarettes   ??? Smokeless tobacco: Never Used   Substance and Sexual Activity   ??? Alcohol use: Yes     Alcohol/week: 1.2 oz     Types: 2 Shots of liquor per week     Comment: 3 beers daily   ??? Drug use: Yes     Frequency: 3.0 times per week     Types: Crack cocaine, Marijuana, Oxycodone     Comment: street oxycodone 1-2 times per day, cocaine once every 2 weeks, marijuana use once daily   ??? Sexual activity: None   Lifestyle   ??? Physical activity:     Days per week: None     Minutes per session: None   ??? Stress: None   Relationships   ??? Social connections:     Talks on phone: None     Gets together: None     Attends religious service: None     Active member of club or organization: None     Attends meetings of clubs or organizations: None     Relationship status: None   Other Topics Concern   ??? Do you use sunscreen? No   ???  Tanning bed use? No   ??? Are you easily burned? No   ??? Excessive sun exposure? No   ??? Blistering sunburns? No   Social History Narrative    Has 2 daughters, lives in [place], sometimes in a shelter        FAMILY HISTORY:    family history includes Cancer in his father and sister; Diabetes in his mother; No Known Problems in his brother, maternal aunt, maternal grandfather, maternal grandmother, maternal uncle, paternal aunt, paternal grandfather, paternal grandmother, and paternal uncle.      VITAL SIGNS:    BP 102/58  - Pulse 60  - Temp 37 ??C (98.6 ??F)  - Resp 20  - Ht 185.4 cm (6' 1)  - Wt 63 kg (138 lb 14.4 oz)  - SpO2 100%  - BMI 18.33 kg/m??     PHYSICAL EXAM:    Constitutional:   Alert, oriented x 3, no acute distress though chronically ill appearing   Mental Status:   Thought organized, appropriate affect, pleasantly interactive, not anxious appearing.   HEENT:   Anicteric sclera, poor dentition; temporal wasting noted   Respiratory: Clear to auscultation with distant lung sounds   Cardiac: Euvolemic, regular rate and rhythm, normal S1 and S2, no murmur.     Abdomen: Soft, normal bowel sounds, non-distended, non-tender, no organomegaly or masses.     Perianal/Rectal Exam Not performed.     Extremities:   No edema, s/p left AKA   Musculoskeletal: No joint swelling or tenderness noted, no deformities.     Skin: Palmar erythema noted; no jaundice; no spider angiomas.      Neuro: No focal deficits.               Results for orders placed or performed in visit on 12/25/17   Comprehensive metabolic panel   Result Value Ref Range    Sodium 146 (H) 135 - 145 mmol/L    Potassium 4.7 3.5 - 5.0 mmol/L    Chloride 105 98 - 107 mmol/L    CO2 29.0 22.0 - 30.0 mmol/L    BUN 15 7 - 21 mg/dL    Creatinine 0.45 4.09 - 1.30 mg/dL    BUN/Creatinine Ratio 21     EGFR MDRD Non Af Amer 115 >=60 mL/min/1.73m2    EGFR MDRD Af Amer 139 >=60 mL/min/1.14m2    Anion Gap 12 9 - 15 mmol/L    Glucose 105 65 - 179 mg/dL    Calcium 9.4 8.5 - 81.1 mg/dL    Albumin 4.5 3.5 - 5.0 g/dL    Total Protein 8.1 6.5 - 8.3 g/dL    Total Bilirubin 0.7 0.0 - 1.2 mg/dL    AST 38 19 - 55 U/L    ALT 39 19 - 72 U/L    Alkaline Phosphatase 95 38 - 126 U/L   PT-INR   Result Value Ref Range    PT 12.6 10.2 - 12.8 sec INR 1.11    Hepatitis A IgG   Result Value Ref Range    Hepatitis A IgG Reactive (A) Nonreactive   Hepatitis B Surface Antibody   Result Value Ref Range    Hep B S Ab Nonreactive Nonreactive, [place]    Hepatitis B Surface Ab Quant <8.00 <8.00 m(IU)/mL   Hepatitis B Surface Antigen   Result Value Ref Range    Hepatitis B Surface Ag Nonreactive Nonreactive   Hepatitis B Core Antibody, total   Result Value Ref Range    Hep  B Core Total Ab Nonreactive Nonreactive   TSH   Result Value Ref Range    TSH 0.691 0.600 - 3.300 uIU/mL   AFP non-maternal tumor marker   Result Value Ref Range    AFP-Tumor Marker 6.48 <7.51 ng/mL   CBC w/ Differential   Result Value Ref Range    WBC 4.0 (L) 4.5 - 11.0 10*9/L    RBC 4.90 4.50 - 5.90 10*12/L    HGB 14.4 13.5 - 17.5 g/dL    HCT 81.1 91.4 - 78.2 %    MCV 90.3 80.0 - 100.0 fL    MCH 29.4 26.0 - 34.0 pg    MCHC 32.6 31.0 - 37.0 g/dL    RDW 95.6 21.3 - 08.6 %    MPV 10.6 (H) 7.0 - 10.0 fL    Platelet 145 (L) 150 - 440 10*9/L    Neutrophils % 50.1 %    Lymphocytes % 34.9 %    Monocytes % 7.6 %    Eosinophils % 4.4 %    Basophils % 1.1 %    Absolute Neutrophils 2.0 2.0 - 7.5 10*9/L    Absolute Lymphocytes 1.4 (L) 1.5 - 5.0 10*9/L    Absolute Monocytes 0.3 0.2 - 0.8 10*9/L    Absolute Eosinophils 0.2 0.0 - 0.4 10*9/L    Absolute Basophils 0.0 0.0 - 0.1 10*9/L    Large Unstained Cells 2 0 - 4 %          ASSESSMENT:        This is a 62 y.o. male with a PMH notable for PAD s/p Left AKA, DM, HLD, polysubstance abuse, and hepatitis C who presents for hepatitis C genotype 1B, diagnosed in January 2019 as part of evaluation of mild transaminase elevations. He has never been treated. He has no prior diagnosis of liver disease though he does have a significant alcohol abuse history and is still drinking 24-48ounces of beer per day. We counseled him to quit drinking all alcohol.   Today we discussed the natural history of HCV infection, including the risks for progression to cirrhosis, liver failure, liver cancer, and risks of hepatocellular carcinoma. Patient is aware of the need for continued follow-up and monitoring.  We discussed the importance of remaining abstinent from alcohol due to additive effects on disease progression to cirrhosis.  We discussed potential treatment options that include all-oral regimens with low rates of side effects and high rates of cure (sustained virological response).   Will plan to evaluate with lab work below and fibroscan.     He also reports significant weight loss (40-50lbs, confirmed in Epic) over the past 5 years that is not entirely explained by undergoing an above the knee amputation in 2014. He has never had an EGD or colonoscopy. Will order both to further evaluate for Gi explanations for weight loss, namely malignancy. Will obtain MRI abdomen and pelvis to evaluate liver for [facility] (and look for any other causative lesions). He has a significant smoking history and is at risk for lung cancer though will defer further evaluation with Chest CT to his PCP.       PLAN:          - patient will meet with Dr. Dayton Scrape (hepatitis pharmacist) to discuss treatment options for Hep C  - patient advised to abstain from alcohol, tobacco products, and illicit drugs  - follow up CBC, CMP, INR, and AFP  - follow up Hepatitis A IgG; vaccinate against hepatitis A if negative.  -  follow up hepatitis B sAb, sAg, cAb; vaccinate against hepatitis B if indicated by these results  - fibroscan today (Addendum - consistent with F3 disease)  - MRI abdomen and pelvis with contrast  - EGD and colonoscopy ordered  - consider chest CT to evaluate for lung cancer if work up of weight loss with MRI, EGD, and Colonoscopy is unremarkable.   - RTC in 3 months    Patient was seen and discussed with Dr. Foy Guadalajara who is in agreement with above stated plan.      Time spent: 45 minutes, 50 % or more spent in counseling and education.    I have seen and examined this patient with the Resident.  I have interviewed and examined the patient, discussed the findings with the Resident, and discussed the plan of care.  I agree with the assessment and plan as described. Needs HBV vaccine next visit. Immune to HAV. F/U with PCP for ongoing evaluation of weight loss.    [name], M.D.  Professor of Medicine  Director, [facility]  [place] [facility] [place] at [facility]

## 2017-12-25 NOTE — Unmapped (Addendum)
[facility]  FAST ??? Fibrosis Assessment Team  Division of Gastroenterology and Hepatology  ??  ??    ??  FIBROSCAN will be performed to assess hepatic fibrosis (scarring) in order to stage this patient's liver disease. This will assist with evaluating the natural course of the disease and will provide important information regarding prognosis, duration of therapy, and potential response to treatment. This information will also help assess risk for hepatocellular carcinoma and need for liver cancer surveillance.??  ??  FibroscanProcedure:   After obtaining verbal consent, the patient was placed in a supine position. Physical characteristics and landmarks were assessed to establish appropriate mid-axillary intercostal space for probe placement. 50Hz  Shear Wave pulses were applied and the resulting Shear Wave and Propagation Speed detected with a 3.5 MHz ultrasonic signal, using the FibroScan probe.  Skin to liver capsule distance and liver parenchyma were accessed during the entire examination with the FibroScan probe. The patient was instructed to breathe normally and to abstain from sudden movements during the procedure, allowing for random measurements of liver stiffness. At least ten Shear Waves were produced; individual measurements of each Shear Wave were calculated. Patient tolerated the procedure well and was discharged without incident.  ??  Probe used [x]  M+    Serial # [id number]                         []  XL+   Serial # [id number]  ??  Main Etiology of Liver Disease:  [x] HCV     [] HBV   [] Alcohol    [] NASH  [] PBC      [] PSC     [] Other________________  ??  50Hz  shear wave pulses were applied and the resulting shear wave and propagation speed detected with a 3.5 MHz ultrasonic signal, using the FibroScan probe.     ??  At least ten Shear Waves were produced; individual measurements of each shear wave were calculated.    ??  Patient tolerated the procedure well and was discharged without incident.  ??  Fibroscan score: ___10.4___kPa  ??  IQR:                       ___5___%  ??  Test performed by: Luiz Ochoa, RN  ??  [facility]  FAST ??? Fibrosis Assessment Team  Division of Gastroenterology and Hepatology  ??  ??    ??  ??  ??  Estimation of the stage of liver fibrosis (Metavir Score):  The results of the Liver Stiffness Score are consistent with the following liver fibrosis stage:  ??  ??  []  F0-F1             []  F2               [x]  F3               []  F4  ??  ??  GENERAL RECOMMENDATIONS ACCORDING TO THE STAGE OF LIVER FIBROSIS.  ??  F0-F1: No-minimal fibrosis. The risk of progression to advanced fibrosis and cirrhosis is low. If the cause of liver disease is not removed, a 1-2 yr follow-up study is recommended.   F2: Significant fibrosis. There is a moderate risk of progression to cirrhosis. If the cause of liver disease is not removed, a follow-up study in 12 months is recommended.  F3: Advanced (pre-cirrhotic stage). The risk of progression to cirrhosis is high. Imaging studies to rule out hepatocellular carcinoma  should be considered. Efforts to remove the cause of liver disease are highly recommended.  F4: Cirrhosis. There is significant risk of portal hypertension and esophageal varices. An upper endoscopy is recommended. Imaging studies for hepatocellular carcinoma screening are recommended.   ??  Any and all FibroScan studies must be carefully evaluated, taking fully into account all individual measurement/scans, patient history and other factors.  As with liver biopsy, any estimation of liver fibrosis may be subject to under or over staging due to sampling error.  Any further medical or surgical intervention should be made only while fully considering the circumstances of this patient and in consultation with this patient.    I have reviewed and interpreted the FIBROSCAN test results as described above.

## 2017-12-26 LAB — HEPATITIS B SURFACE ANTIGEN: Hepatitis B virus surface Ag:PrThr:Pt:Ser:Ord:: NONREACTIVE

## 2017-12-26 LAB — HEPATITIS A IGG: Hepatitis A virus Ab.IgG:PrThr:Pt:Ser:Ord:: REACTIVE — AB

## 2017-12-26 LAB — HEPATITIS B SURFACE ANTIBODY: HEPATITIS B SURFACE ANTIBODY: NONREACTIVE

## 2017-12-26 LAB — HEPATITIS B SURFACE ANTIBODY QUANT: Hepatitis B virus surface Ab:ACnc:Pt:Ser:Qn:: <8

## 2017-12-26 LAB — HEPATITIS B CORE TOTAL ANTIBODY: Hepatitis B virus core Ab:PrThr:Pt:Ser/Plas:Ord:IA: NONREACTIVE

## 2017-12-30 MED ORDER — LEDIPASVIR 90 MG-SOFOSBUVIR 400 MG TABLET: 1 | each | 2 refills | 0 days

## 2017-12-30 MED ORDER — LEDIPASVIR 90 MG-SOFOSBUVIR 400 MG TABLET
Freq: Every day | ORAL | 2 refills | 0.00000 days | Status: CP
Start: 2017-12-30 — End: 2018-12-30

## 2017-12-30 NOTE — Unmapped (Signed)
Per test claim for LEDIPASVIR-SOFOSBIVUR TABS at the [facility], patient needs Medication Assistance Program for Prior Authorization.

## 2018-01-03 NOTE — Unmapped (Signed)
Spoke with pt's daughter, Arlina Robes who was present during clinic visit on 12/25/17. Advised approval of Harvoni. Reminded her of MRI scheduled on 01/06/18 and will review MRI result then arrange for shipment. She verbalized understanding.

## 2018-01-06 ENCOUNTER — Ambulatory Visit: Admit: 2018-01-06 | Discharge: 2018-01-06 | Payer: MEDICARE

## 2018-01-06 DIAGNOSIS — R768 Other specified abnormal immunological findings in serum: Principal | ICD-10-CM

## 2018-01-07 NOTE — Unmapped (Addendum)
Initial Counseling for HCV Treatment     Planned regimen: Harvoni (ledipasvir/sofosbuvir 90/400mg ) x 12 weeks  Planned start date: 01/10/2018    Pharmacy: [facility] [phone] option #4      PMH:   Past Medical History:   Diagnosis Date   ??? Diabetes (CMS-HCC) 11/27/2012    Type II   ??? 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 Neuropathic pain, leg 08/04/2013   ??? PAD (peripheral artery disease) (CMS-HCC)    ??? Tobacco abuse        Current meds: Acetaminophen, aspirin 81 mg, clopidogrel, glipizide, latanoprost ophthalmic, metformin, oxycodone, simvastatin 10mg .    Patient is ready to start Harvoni. Spoke with pt's daughter, Arlina Robes who was present at pt's clinic visit.    Following topics were discussed during counseling:   Patient Counseling    Counseled the patient on the following:  doses and administration discussed, safe handling, storage, and disposal discussed, possible adverse effects and management discussed, possible drug and prescription drug interactions discussed, possible drug and OTC drug and food interactions discussed, lab monitoring and follow-up discussed, therapeutic rationale discussed, cost of medications and cost implications discussed, adherence and missed doses discussed, pharmacy contact information discussed        1. Indications for medication, dosage and administration.     A. Harvoni 90/400mg  1 tablet to take daily with or without food. Patient plans to take in the morning with other medications.      2. Common side effects of medications and management strategies. (fatigue, headache, nausea)      3. Importance of adherence to regimen, follow-up clinic visits and lab monitoring.   Medication Adherence    Demonstrates understanding of importance of adherence:  yes  Informant:  child/children  Reliability of informant:  reliable  Patient is at risk for Non-Adherence:  No  Reasons for non-adherence:  no problems identified  Support network for adherence:  family member  Confirmed plan for next specialty medication refill:  delivery by pharmacy       A. Asked patient to call [facility] [phone] to establish start date for treatment and to schedule appointment 4 weeks before starting treatment.      4. Drug-drug interaction.  Drug Interactions    Drug interactions evaluated:  yes  Clinically relevant drug interactions identified:  yes   Interactions list:  Simvastatin: simvastatin levels may be increased    Drug management plan:  Advised pt of potential increased levels of simvastatin if taken with Harvoni. Advised pt to monitor for potential increased side effects from statin (i.e. Muscle ache, dark urine etc) and to notify PCP of these adverse events.      Provided the patient with educational material regarding drug interactions:  not applicable       A. Current medications have been reviewed and assessed for possible interaction.  Denies antacid/heartburn medications. We discussed the mechanism of drug-drug interaction with acid lowering agents. Advised to check with MD or pharmacist before taking any OTC/herbal medications, with emphasis regarding indigestion/heartburn medications.  Denies use of herbal medication such as milk thistle or St. John's wart.  Allergies have been verified.      5. Importance of informing pharmacy and clinic of updated contact information. Stressed importance of being able to reach over the phone to set up refills. Advised patient to call pharmacy when down to about 7 day supply left to ensure there's no interruption in therapy.  Refill Coordination    Has the Patients' Contact Information Changed:  No  Is the Shipping Address Different:  Yes  Updated Shipping Address:  [address] [place], [place] [address]        Shipping Information    Delivery Scheduled:  Yes  Delivery Date:  01/09/18  Medications to be Shipped:  Harvoni       Patient's daughter verbalized understanding. Provided contact information for any questions/concerns.       Daine Gip, Pharm D. Candidate, PY4  [facility] [employer] [facility]    Park Breed, Pharm D., BCPS, [place], CPP  [facility]  [address]  [place], [place] [address]  [phone]      Jan 07, 2018 10:33 AM

## 2018-01-09 NOTE — Unmapped (Signed)
Loran Senters, FNP      We received a referral for your patient to undergo: ____COLONOSCOPY & EGD ______________________. Your patient takes Plavix (Clopidigrel),  which can increase the risk of bleeding following certain procedures. The American Society of Gastrointestinal Endoscopy (PrizeAndShine.co.uk) recommends:    ? Low thromboembolic risk: hold 7 days before and possibly after the procedure.    ? High thromboembolic risk: hold 7 days before and possibly after the procedure.  Aspirin monotherapy can be considered.  If it is not possible to discontinue one of the agents mentioned above then the procedure should be postponed    Based on these recommendations, the patient???s medical condition, and the bleeding risk associated with the procedure, please recommend:    ? May stop the medication ____ days prior to the procedure.  ? Must continue medication throughout the peri-procedural period. If so, please call [phone] to discuss this, including whether the procedure must be postponed.     We will call the patient, convey your recommendations, and schedule their appointment. If you are not the prescribing clinician then please let us know who we should contact to manage this medication.     Sincerely,    Danne Baxter, [facility]  [facility] GI Procedures Triage Nurse Care Coordinator  P: [phone]  F: [phone]

## 2018-01-23 NOTE — Unmapped (Signed)
We received a referral for your patient to undergo: EGD AND COLONOSCOPY __________________________. Your patient takes Plavix (Clopidigrel),  which can increase the risk of bleeding following certain procedures. The American Society of Gastrointestinal Endoscopy (PrizeAndShine.co.uk) recommends:    ? Low thromboembolic risk: hold 7 days before and possibly after the procedure.    ? High thromboembolic risk: hold 7 days before and possibly after the procedure.  Aspirin monotherapy can be considered.  If it is not possible to discontinue one of the agents mentioned above then the procedure should be postponed    Based on these recommendations, the patient???s medical condition, and the bleeding risk associated with the procedure, please recommend:    ? May stop the medication ____ days prior to the procedure.  ? Must continue medication throughout the peri-procedural period. If so, please call [phone] to discuss this, including whether the procedure must be postponed.     We will call the patient, convey your recommendations, and schedule their appointment. If you are not the prescribing clinician then please let us know who we should contact to manage this medication.     Sincerely,    Danne Baxter, [facility]  [facility] GI Procedures Triage Nurse Care Coordinator  P: [phone]  F: [phone]

## 2018-01-27 ENCOUNTER — Ambulatory Visit: Admit: 2018-01-27 | Discharge: 2018-01-28 | Payer: MEDICARE | Attending: Ophthalmology | Primary: Ophthalmology

## 2018-01-27 DIAGNOSIS — H2513 Age-related nuclear cataract, bilateral: Secondary | ICD-10-CM

## 2018-01-27 DIAGNOSIS — H401134 Primary open-angle glaucoma, bilateral, indeterminate stage: Secondary | ICD-10-CM

## 2018-01-27 DIAGNOSIS — H524 Presbyopia: Secondary | ICD-10-CM

## 2018-01-27 DIAGNOSIS — H401132 Primary open-angle glaucoma, bilateral, moderate stage: Principal | ICD-10-CM

## 2018-01-27 DIAGNOSIS — E119 Type 2 diabetes mellitus without complications: Secondary | ICD-10-CM

## 2018-01-27 DIAGNOSIS — H40023 Open angle with borderline findings, high risk, bilateral: Secondary | ICD-10-CM

## 2018-01-27 MED ORDER — LATANOPROST 0.005 % EYE DROPS
Freq: Every evening | OPHTHALMIC | 11 refills | 0 days | Status: CP
Start: 2018-01-27 — End: 2018-08-04

## 2018-01-27 NOTE — Unmapped (Signed)
-   Continue with reading glasses

## 2018-01-27 NOTE — Unmapped (Signed)
-   A1C 7.0, > 10 years   - no retinopathy today  - Monitor with q yearly DFE

## 2018-01-27 NOTE — Unmapped (Signed)
Subjective: Mr./Ms. Randy Bray presents with CC of decreased vision and needing diabetic eye exam.   Chief Complaint   Patient presents with   ??? HVF   ??? IOP Check   ??? 9 month follow-up   .     HPI     62 y/o patient here for a 9 month follow-up with Intraocular Pressure check and Humphrey Visual Field testing due to primary open angle glaucoma, bilateral, moderate stage.     Patient states fasting blood sugar was 132 yesterday morning. Patient states no changes in vision since last visit. Was using latanoprost at bedtime in both eyes states patient but now he is out.     Lab Results       Component                Value               Date                       A1C                      6.1 (A)             12/17/2017                Last edited by Olena Mater on 01/27/2018 10:57 AM. (History)          Review of Systems: General, Psychological, ENT, Allergy and Immunology, Endocrine, Breast, Respiratory, Cardiovascular, GI, GU, Musculoskeletal, Neurological, and Dermatological all negative. Ophthalmological positive as above.     History:   Past Medical History:   Diagnosis Date   ??? Diabetes (CMS-HCC) 11/27/2012    Type II   ??? 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 Neuropathic pain, leg 08/04/2013   ??? PAD (peripheral artery disease) (CMS-HCC)    ??? Tobacco abuse      Past Surgical History:   Procedure Laterality Date   ??? ANGIOPLASTY FEM POP UNIL [facility] HISTORICAL RESULT)     ??? Left SFA to PT bypass with PTFE  02/2012   ??? PILONIDAL CYST / SINUS EXCISION     ??? PR AMPUTATE THIGH,THRU FEMUR Left 01/29/2013    Procedure: POSS. L. AKA;  Surgeon: Marion Downer, MD;  Location: MAIN OR [facility];  Service: Vascular   ??? PR AMPUTATION LOW LEG THRU TIB/FIB Left 11/28/2012    Procedure: AMPUTA LEG THRU TIBIA & FIBULA;  Surgeon: Marion Downer, MD;  Location: MAIN OR [facility];  Service: Vascular   ??? PR DEBRIDEMENT, SKIN, SUB-Q TISSUE,MUSCLE,BONE,=<20 SQ CM Left 01/29/2013    Procedure: DEBRIDEMENT; SKIN, SUBCUTANEOUS TISSUE, MUSCLE, & BONE;  Surgeon: Marion Downer, MD;  Location: MAIN OR [facility];  Service: Vascular   ??? PR RE-AMPUTATION LOWER LEG Left 01/29/2013    Procedure: REVISION OF L. BKA;  Surgeon: Marion Downer, MD;  Location: MAIN OR [facility];  Service: Vascular     Family History   Problem Relation Age of Onset   ??? Diabetes Mother    ??? Cancer Father         lung   ??? Cancer Sister         pancreatic   ??? No Known Problems Brother    ??? No Known Problems Maternal Aunt    ??? No Known Problems Maternal Uncle    ??? No Known Problems Paternal Aunt    ??? No  Known Problems Paternal Uncle    ??? No Known Problems Maternal Grandmother    ??? No Known Problems Maternal Grandfather    ??? No Known Problems Paternal Grandmother    ??? No Known Problems Paternal Grandfather    ??? Amblyopia Neg Hx    ??? Blindness Neg Hx    ??? Cataracts Neg Hx    ??? Glaucoma Neg Hx    ??? Hypertension Neg Hx    ??? Macular degeneration Neg Hx    ??? Retinal detachment Neg Hx    ??? Strabismus Neg Hx    ??? Stroke Neg Hx    ??? Thyroid disease Neg Hx      Social History     Socioeconomic History   ??? Marital status: Divorced     Spouse name: Not on file   ??? Number of children: Not on file   ??? Years of education: Not on file   ??? Highest education level: Not on file   Occupational History   ??? Occupation: Curator   Social Needs   ??? Financial resource strain: Not on file   ??? Food insecurity:     Worry: Not on file     Inability: Not on file   ??? Transportation needs:     Medical: Not on file     Non-medical: Not on file   Tobacco Use   ??? Smoking status: Current Some Day Smoker     Packs/day: 0.25     Years: 40.00     Pack years: 10.00     Types: Cigarettes   ??? Smokeless tobacco: Never Used   Substance and Sexual Activity   ??? Alcohol use: Yes     Alcohol/week: 1.2 oz     Types: 2 Shots of liquor per week     Comment: 3 beers daily   ??? Drug use: Yes     Frequency: 3.0 times per week     Types: Crack cocaine, Marijuana, Oxycodone     Comment: street oxycodone 1-2 times per day, cocaine once every 2 weeks, marijuana use once daily   ??? Sexual activity: Not on file   Lifestyle   ??? Physical activity:     Days per week: Not on file     Minutes per session: Not on file   ??? Stress: Not on file   Relationships   ??? Social connections:     Talks on phone: Not on file     Gets together: Not on file     Attends religious service: Not on file     Active member of club or organization: Not on file     Attends meetings of clubs or organizations: Not on file     Relationship status: Not on file   Other Topics Concern   ??? Do you use sunscreen? No   ??? Tanning bed use? No   ??? Are you easily burned? No   ??? Excessive sun exposure? No   ??? Blistering sunburns? No   Social History Narrative    Has 2 daughters, lives in [place], sometimes in a shelter      2 Children. Likes to Adult nurse work.     Medications:   Current Outpatient Medications on File Prior to Visit   Medication Sig Dispense Refill   ??? acetaminophen (TYLENOL) 325 MG tablet Take 650MG  PO every 6 hours as needed for pain. Do not exceed 3G daily 30 tablet 0   ??? aspirin (ADULT LOW DOSE ASPIRIN)  81 MG tablet Take 81 mg by mouth daily.      ??? blood sugar diagnostic Strp by Other route Two (2) times a day. 100 each 11   ??? clopidogrel (PLAVIX) 75 mg tablet Take 1 tablet (75 mg total) by mouth daily. 90 tablet 3   ??? glipiZIDE (GLUCOTROL) 5 MG tablet Take 1 tablet (5 mg total) by mouth once daily. 90 tablet 3   ??? lancets Misc Use lancet for checking blood sugar two times a day 100 each 11   ??? ledipasvir 90 mg-sofosbuvir 400 mg (HARVONI) tablet Take 1 tablet by mouth daily. 28 tablet 2   ??? metFORMIN (GLUCOPHAGE) 1000 MG tablet Take 1 tablet (1,000 mg total) by mouth 2 (two) times a day with meals. 180 tablet 3   ??? oxyCODONE (ROXICODONE) 5 MG immediate release tablet Take 1-2 tabs every 4 hours for pain as needed. Do not drive while taking this medication 180 tablet 0   ??? simvastatin (ZOCOR) 10 MG tablet Take 1 tablet (10 mg total) by mouth nightly. 90 tablet 3     No current facility-administered medications on file prior to visit.        Objective:   Base Eye Exam     Visual Acuity (Snellen - Linear)       Right Left    Dist [place] 20/20 -1 20/20 -1          Tonometry (Tonopen, 10:45 AM)       Right Left    Pressure 15 15          Tonometry #2 (Applanation, 11:52 AM)       Right Left    Pressure 13 13          Pupils       Dark Shape React APD    Right 3 Round Minimal None    Left 3 Round Minimal None          Visual Fields (Counting fingers)       Left Right     Full Full          Extraocular Movement       Right Left     Full, Ortho Full, Ortho          Neuro/Psych     Oriented x3:  Yes    Mood/Affect:  Normal            Slit Lamp and Fundus Exam     External Exam       Right Left    External Normal Normal          Slit Lamp Exam       Right Left    Lids/Lashes Normal Normal    Conjunctiva/Sclera White and quiet White and quiet    Cornea Clear Clear    Anterior Chamber Deep and quiet Deep and quiet    Iris Round and reactive Round and reactive    Lens 2+ NSC 2+ NSC    Vitreous Normal Normal            Refraction     Wearing Rx       Sphere Cylinder    Right +2.50 Sphere    Left +2.50 Sphere    Age:  OTC    Type:  Readers          Manifest Refraction    Patient deferred refraction.  Assessment / Plan:   Diabetes mellitus type 2 without retinopathy (CMS-HCC)  - A1C 7.0, > 10 years   - no retinopathy today  - Monitor with q yearly DFE     Age-related nuclear cataract of both eyes  - Mild, monitor     Presbyopia of both eyes  - Continue with reading glasses    Primary open angle glaucoma (POAG) of both eyes, indeterminate stage  - 2/2 large c/d ratio, sleeps on right side on hand, no problems breathing, no OSA   - T max: 15/15 (today)  - T today: 13/13  - Fam Hx: none  - Surgical Hx: none  - SLT Hx: none  - Most Recent Gonio (04/10/17): D(E)402+, iris processes OU, no angle recession OU   - CCT: 489/478  - OCT RNFL (03/27/17): 96/96, 109/123; considerable asymmetry on RNFL and GCL scans   - VF (01/27/18): superior arcuate OD; nonspecific changes OS, unreliable OU  - Continue latanoprost  - RTC in 6 mo with repeat OCT RNFL    Return in about 6 months (around 07/29/2018).        Vergie Living, MD

## 2018-01-27 NOTE — Unmapped (Addendum)
-   2/2 large c/d ratio, sleeps on right side on hand, no problems breathing, no OSA   - T max: 15/15 (today)  - T today: 13/13  - Fam Hx: none  - Surgical Hx: none  - SLT Hx: none  - Most Recent Gonio (04/10/17): D(E)402+, iris processes OU, no angle recession OU   - CCT: 489/478  - OCT RNFL (03/27/17): 96/96, 109/123; considerable asymmetry on RNFL and GCL scans   - VF (01/27/18): superior arcuate OD; nonspecific changes OS, unreliable OU  - Continue latanoprost  - RTC in 6 mo with repeat OCT RNFL

## 2018-01-27 NOTE — Unmapped (Deleted)
-   2/2 large c/d ratio, sleeps on right side on hand, no problems breathing, no OSA   - T max: 15/15 (today)  - T today: 15/15  - Fam Hx: none  - Surgical Hx: none  - SLT Hx: none  - Most Recent Gonio (04/10/17): D(E)402+, iris processes OU, no angle recession OU   - CCT: 489/478  - OCT RNFL (03/27/17): 96/96, 109/123; considerable asymmetry on RNFL and GCL scans   - VF (04/10/17): superior arcuate OD; inferior rim vs. Arcuate OS  - Continue latanoprost  - RTC in 8 weeks with repeat VF and pressure check

## 2018-01-27 NOTE — Unmapped (Signed)
Mild, monitor

## 2018-01-30 NOTE — Unmapped (Signed)
Follow-Up Counseling for HCV Treatment    Regimen: Harvoni (ledipasvir/sofosbuvir 90/400mg ) x 12 weeks  Start Date: 01/15/18  Completed Treatment Week #2    Pharmacy: [facility] [phone] (option #4)      Following topics were reviewed during the phone call:  Patient Counseling    Counseled the patient on the following:  doses and administration discussed, possible adverse effects and management discussed, possible drug and OTC drug and food interactions discussed, lab monitoring and follow-up discussed, therapeutic rationale discussed, adherence and missed doses discussed, pharmacy contact information discussed         1. Medication administration - Takes Harvoni every morning at 10am.    2. Importance of adherence -   Medication Adherence    Patient reported X missed doses in the last month:  0  Specialty Medication:  Harvoni  Patient is on additional specialty medications:  No  Patient is on more than two specialty medications:  No  Any gaps in refill history greater than 2 weeks in the last 3 months:  no  Demonstrates understanding of importance of adherence:  yes  Informant:  patient  Reliability of informant:  reliable  Provider-estimated medication adherence level:  90-100%  Patient is at risk for Non-Adherence:  No  Reasons for non-adherence:  no problems identified  Support network for adherence:  family member  Confirmed plan for next specialty medication refill:  delivery by pharmacy      Pt denies any missed doses of Harvoni, pill count over the phone revealed #12 tablets which is appropriate and reflects 100% adherence.    3. Side effects - No new side effects to report.  Pt says he has been tolerating medication very well.  Adverse Effects    *All other systems reviewed and are negative       4. Drug-drug interaction - Pt denies muscle aches/dark urine but will continue to monitor.  No new medications/OTC products.  Drug Interactions    Drug interactions evaluated:  yes Clinically relevant drug interactions identified:  yes   Interactions list:  Simvastatin: simvastatin levels may be increased    Drug management plan:  Advised pt of potential increased levels of simvastatin if taken with Harvoni. Advised pt to monitor for potential increased side effects from statin (i.e. Muscle ache, dark urine etc) and to notify PCP of these adverse events.      Provided the patient with educational material regarding drug interactions:  not applicable       5. Follow up - Pt has not scheduled 4-week follow up appointment in HCV treatment clinic.  Pt requested that Vincenza Hews call his daughter, Lorina Rabon [phone]), to schedule 4-week f/u for him  Scheduled patient's Harvoni refill to be delivered on 02/04/18. Pt requested medication be shipped to his mother's house ([address], [place], [place] [address]), different address than what is currently in [facility].  Shipping Information    Delivery Scheduled:  Yes  Delivery Date:  02/04/18  Medications to be Shipped:  Harvoni       Refill Coordination    Has the Patients' Contact Information Changed:  Yes  Is the Shipping Address Different:  Yes  Updated Shipping Address:  [address] [place], [place] [address]       All questions were answered.  Pt verbalized understanding.      Casimer Bilis, PharmD Candidate  January 30, 2018 3:32 PM    Park Breed, Pharm D., BCPS, BCGP, CPP  [facility]  [address]  [place], [place] [address]  [phone]

## 2018-02-04 NOTE — Unmapped (Signed)
Randy Bray set up delivery for deaire and it went out 6/18  Closing this encounter

## 2018-02-04 NOTE — Unmapped (Signed)
Mr. Stehlin has a referral for a colonoscopy in the system, that ws put in by a [facility].  Mr. Loh does not want to go to [facility] on [address] for his colonoscopy.  He is asking if Lorina Rabon can put in a referral for procedure at [facility] please.

## 2018-02-06 NOTE — Unmapped (Signed)
Addended by: Natale Milch on: 02/06/2018 08:22 AM     Modules accepted: Orders

## 2018-03-03 NOTE — Unmapped (Signed)
Pt requests we send this to daughter Randy Bray's address - [address] [place] [address]  Pt was not able to do a tablet count at the time of the call  Has been having headaches-sending message to [facility] Refill Coordination Note    Specialty Medication(s) to be Shipped:   Infectious Disease: Harvoni    Other medication(s) to be shipped: n/a     Randy Bray, DOB: 03-Nov-1955  Phone: [phone] (home)   Shipping Address: [address]  [place] [address]    All above HIPAA information was verified with patient.     Completed refill call assessment today to schedule patient's medication shipment from the [facility] [phone]).       Specialty medication(s) and dose(s) confirmed: Regimen is correct and unchanged.   Changes to medications: Randy Bray reports no changes reported at this time.  Changes to insurance: No  Questions for the pharmacist: No    The patient will receive an [facility] print out for each medication shipped and additional FDA Medication Guides as required.  Patient education from [name] or Randy Bray may also be included in the shipment.    DISEASE/MEDICATION-SPECIFIC INFORMATION        For Hepatitis C patients:  Treatment start date: 5/24  Current treatment week: 7    ADHERENCE     Medication Adherence    Patient reported X missed doses in the last month:  0  Specialty Medication:  HARVONI  Patient is on additional specialty medications:  No  Patient is on more than two specialty medications:  No  Any gaps in refill history greater than 2 weeks in the last 3 months:  no  Demonstrates understanding of importance of adherence:  yes  Informant:  patient  Reliability of informant:  reliable  Support network for adherence:  family member  Confirmed plan for next specialty medication refill:  delivery by pharmacy  Refills needed for supportive medications:  not needed          Refill Coordination    Has the Patients' Contact Information Changed:  No Is the Shipping Address Different:  Yes  Updated Shipping Address:  [address] [place] [place] [address]           SHIPPING     Shipping address confirmed in [facility].     Delivery Scheduled: Yes, Expected medication delivery date: 7/19 via UPS or courier.     Randy Bray   [facility] Specialty Technician

## 2018-03-03 NOTE — Unmapped (Addendum)
Follow up on [facility] Refill coordination     Patient complained of headache during his Harvoni refill coordination call on 03/03/18. Followed up with patient and he's reporting tolerable headache that comes and goes which is relieved by acetaminophen 650mg  for about 3-4 hours at a time. Patient does endorse drinking plenty of water but also endorses doing nothing but watching tv all day which admitted may be contributing to his headache. Denies these headaches are interfering with his daily activities and will continue on his Harvoni treatment. Advised ok to take acetaminophen up to 2000mg /day. Reminded patient to remain abstinent of alcohol and of his follow up appointment on 03/11/18 with Owens Shark, DNP. Pt verbalized understanding.

## 2018-03-07 NOTE — Unmapped (Signed)
CALLED LEFT MESSAGE   NEED ANSWER CONCERNING MANAGEMENT OF PLAVIX    We received a referral for your patient to undergo: ____EGD AND COLONOSCOPY ______________________. Your patient takes Plavix (Clopidigrel),  which can increase the risk of bleeding following certain procedures. The American Society of Gastrointestinal Endoscopy (PrizeAndShine.co.uk) recommends:    ? Low thromboembolic risk: hold 7 days before and possibly after the procedure.    ? High thromboembolic risk: hold 7 days before and possibly after the procedure.  Aspirin monotherapy can be considered.  If it is not possible to discontinue one of the agents mentioned above then the procedure should be postponed    Based on these recommendations, the patient???s medical condition, and the bleeding risk associated with the procedure, please recommend:    ? May stop the medication ____ days prior to the procedure.  ? Must continue medication throughout the peri-procedural period. If so, please call [phone] to discuss this, including whether the procedure must be postponed.     We will call the patient, convey your recommendations, and schedule their appointment. If you are not the prescribing clinician then please let us know who we should contact to manage this medication.     Sincerely,    Danne Baxter, [facility]  [facility] Procedures Triage Nurse Care Coordinator  P: [phone]  F: [phone]

## 2018-03-11 ENCOUNTER — Ambulatory Visit: Admit: 2018-03-11 | Discharge: 2018-03-11 | Payer: MEDICARE | Attending: Family | Primary: Family

## 2018-03-11 DIAGNOSIS — B182 Chronic viral hepatitis C: Principal | ICD-10-CM

## 2018-03-11 DIAGNOSIS — M545 Low back pain: Secondary | ICD-10-CM

## 2018-03-11 DIAGNOSIS — Z23 Encounter for immunization: Secondary | ICD-10-CM

## 2018-03-11 LAB — URINALYSIS
BACTERIA: NONE SEEN /HPF
BILIRUBIN UA: NEGATIVE
KETONES UA: NEGATIVE
LEUKOCYTE ESTERASE UA: NEGATIVE
NITRITE UA: NEGATIVE
PH UA: 5.5 (ref 5.0–9.0)
RBC UA: 1 /HPF (ref <=3)
SPECIFIC GRAVITY UA: 1.026 (ref 1.003–1.030)
SQUAMOUS EPITHELIAL: <1 /HPF (ref 0–5)
UROBILINOGEN UA: 4 — AB
WBC UA: 1 /HPF (ref <=2)

## 2018-03-11 LAB — BASIC METABOLIC PANEL
ANION GAP: 8 mmol/L — ABNORMAL LOW (ref 9–15)
BLOOD UREA NITROGEN: 12 mg/dL (ref 7–21)
CALCIUM: 9.3 mg/dL (ref 8.5–10.2)
CHLORIDE: 105 mmol/L (ref 98–107)
CO2: 26 mmol/L (ref 22.0–30.0)
CREATININE: 0.65 mg/dL — ABNORMAL LOW (ref 0.70–1.30)
EGFR CKD-EPI AA MALE: >90 mL/min/{1.73_m2} (ref >=60)
EGFR CKD-EPI NON-AA MALE: >90 mL/min/{1.73_m2} (ref >=60)
GLUCOSE RANDOM: 91 mg/dL (ref 65–179)
POTASSIUM: 4 mmol/L (ref 3.5–5.0)
SODIUM: 139 mmol/L (ref 135–145)

## 2018-03-11 LAB — CBC W/ AUTO DIFF
BASOPHILS ABSOLUTE COUNT: 0.1 10*9/L (ref 0.0–0.1)
BASOPHILS RELATIVE PERCENT: 1.6 %
EOSINOPHILS ABSOLUTE COUNT: 0.3 10*9/L (ref 0.0–0.4)
EOSINOPHILS RELATIVE PERCENT: 7.8 %
HEMATOCRIT: 43.7 % (ref 41.0–53.0)
LARGE UNSTAINED CELLS: 2 % (ref 0–4)
LYMPHOCYTES ABSOLUTE COUNT: 1.5 10*9/L (ref 1.5–5.0)
LYMPHOCYTES RELATIVE PERCENT: 37.9 %
MEAN CORPUSCULAR HEMOGLOBIN CONC: 33 g/dL (ref 31.0–37.0)
MEAN CORPUSCULAR HEMOGLOBIN: 29.2 pg (ref 26.0–34.0)
MEAN CORPUSCULAR VOLUME: 88.3 fL (ref 80.0–100.0)
MONOCYTES ABSOLUTE COUNT: 0.3 10*9/L (ref 0.2–0.8)
MONOCYTES RELATIVE PERCENT: 6.6 %
NEUTROPHILS ABSOLUTE COUNT: 1.7 10*9/L — ABNORMAL LOW (ref 2.0–7.5)
NEUTROPHILS RELATIVE PERCENT: 44.1 %
PLATELET COUNT: 163 10*9/L (ref 150–440)
RED CELL DISTRIBUTION WIDTH: 13.1 % (ref 12.0–15.0)
WBC ADJUSTED: 3.9 10*9/L — ABNORMAL LOW (ref 4.5–11.0)

## 2018-03-11 LAB — HEPATIC FUNCTION PANEL
ALBUMIN: 4.2 g/dL (ref 3.5–5.0)
ALKALINE PHOSPHATASE: 88 U/L (ref 38–126)
AST (SGOT): 20 U/L (ref 19–55)
PROTEIN TOTAL: 7.8 g/dL (ref 6.5–8.3)

## 2018-03-11 LAB — CO2: Carbon dioxide:SCnc:Pt:Ser/Plas:Qn:: 26

## 2018-03-11 LAB — ALKALINE PHOSPHATASE: Alkaline phosphatase:CCnc:Pt:Ser/Plas:Qn:: 88

## 2018-03-11 LAB — BLOOD UA: Lab: NEGATIVE

## 2018-03-11 LAB — NEUTROPHILS RELATIVE PERCENT: Lab: 44.1

## 2018-03-11 NOTE — Unmapped (Signed)
1.  Laboratory studies done today as part of continued hepatitis C care.   2.  Continue taking Harvoni one tablet daily. Avoid missing any doses of medication.   3. Continue to increase water intake.   4.  Okay to take Miralax one capful in 8 ounces of water daily. Able to purchase Miralax over the counter.   5.  First hepatitis B vaccination administered today.   6.  Office follow up five weeks for end of treatment visit.   7.  Pending response from your cardiologist about getting you scheduled for endoscopy evaluation.   8.  Avoid any anti acid medications.   9.  Exchange toothbrushes and razors frequently throughout your treatment.

## 2018-03-11 NOTE — Unmapped (Signed)
labs collected, venipuncture LAC. pt tolerated well.

## 2018-03-11 NOTE — Unmapped (Signed)
[facility]    Randy Bray, M.D.  Professor of Medicine  Director, [facility]  [place] [facility] [place] [facility] [place]    [phone]    REFERRING PROVIDER:  Rosine Bray None  [place], [place] [address]    PRIMARY CARE PROVIDER:  Noralyn Pick, FNP    Reason For Office Visit:  Treatment follow up HCV  Harvoni x 12 weeks  Start Date: 01/15/2018  TW #8    HISTORY OF PRESENT ILLNESS:   This is a 62 y.o. male with a PMH notable for PAD s/p Left AKA, DM, HLD, polysubstance abuse (continued -cocaine, street oxycodone and MJ), and hepatitis C who presents for hepatitis C. He was noted to have elevated transaminases in 2018 and was found to have hepatitis C, genotype 1B. Before this diagnosis, he had never been told he has abnormal LFTs or any liver issues. He thinks he had jaundice several decades ago. No issues with ascites, confusion, or GI bleeding. He denies any family history of liver disease. He was drinking 24-48 ounces of beer daily prior to initiation of treatment. He drank more heavily in the past and has 2 DUIs. He smokes cigarettes and does cocaine occasionally.     He presents today with his daughter and grandson. He has adhered to taking Harvoni one tablet daily. Denies having missed any doses. Taking his tablet b/t 9:30 AM and 10:30 AM. He has experienced minor headaches. Taking Tylenol prn has helped. Denies excess of more than 2,000 mg Tylenol total daily. Appetite remains fair. His biggest complaint of lower back pain for the past two weeks. Denies any injury or trauma. Mostly immobile. Sits watching TV or laying down throughout the day.  Darker color urine at times. Complaint of chronic constipation. Normal BM pattern is every two to three day.     At his last visit, he reported 40-50lb unexplained weight loss over the past 5 or 6 years (some but not all of this is attributable to left AKA). Noted five pound weight gain over the past two months. He has never had an EGD or colonoscopy. Orders where placed for both procedures at his last office visit, but have not been scheduled pending clearance from his cardiologist in reference to chronic antiplatelet therapy.     Pertinent data:  - Jan 2019 lab work: Hep C genotype 1B, RNA 914,782 with log 5.68  - July 2018 lab work: Cr 0.6; Bili 0.8, AST 41, ALT 54, Alk P 81    Liver Care:   1. Chronic hepatitis C, treatment naive, genotype 1B  Baseline HCV RNA: 483,984 IU/mL  HCV RNA TW #8 - pending    2. + immunity hepatitis A.   3. Immunity status hepatitis B: #1 HEPLISAV vaccine administered 03/11/2018  4. Fibroscan: 10.4 kPa/F#  5. HCC screening: MRI of abdomen 01/06/2018 ~No evidence of cirrhosis. Spleen unremarkable. No fluid. + moderate stool burden.     REVIEW OF SYSTEMS:     The balance of 12 systems reviewed is negative except as noted in the HPI.     PAST MEDICAL HISTORY:    Past Medical History:   Diagnosis Date   ??? Diabetes (CMS-HCC) 11/27/2012    Type II   ??? 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 Neuropathic pain, leg 08/04/2013   ??? PAD (peripheral artery disease) (CMS-HCC)    ??? Tobacco abuse      PAST SURGICAL  HISTORY:    Past Surgical History:   Procedure Laterality Date   ??? ANGIOPLASTY FEM POP UNIL [facility] HISTORICAL RESULT)     ??? Left SFA to PT bypass with PTFE  02/2012   ??? PILONIDAL CYST / SINUS EXCISION     ??? PR AMPUTATE THIGH,THRU FEMUR Left 01/29/2013    Procedure: POSS. L. AKA;  Surgeon: Marion Downer, MD;  Location: MAIN OR [facility];  Service: Vascular   ??? PR AMPUTATION LOW LEG THRU TIB/FIB Left 11/28/2012    Procedure: AMPUTA LEG THRU TIBIA & FIBULA;  Surgeon: Marion Downer, MD;  Location: MAIN OR [facility];  Service: Vascular   ??? PR DEBRIDEMENT, SKIN, SUB-Q TISSUE,MUSCLE,BONE,=<20 SQ CM Left 01/29/2013    Procedure: DEBRIDEMENT; SKIN, SUBCUTANEOUS TISSUE, MUSCLE, & BONE;  Surgeon: Marion Downer, MD;  Location: MAIN OR [facility];  Service: Vascular   ??? PR RE-AMPUTATION LOWER LEG Left 01/29/2013    Procedure: REVISION OF L. BKA;  Surgeon: Marion Downer, MD;  Location: MAIN OR [facility];  Service: Vascular     MEDICATIONS:  Current Outpatient Medications   Medication Sig Dispense Refill   ??? acetaminophen (TYLENOL) 325 MG tablet Take 650MG  PO every 6 hours as needed for pain. Do not exceed 3G daily 30 tablet 0   ??? aspirin (ADULT LOW DOSE ASPIRIN) 81 MG tablet Take 81 mg by mouth daily.      ??? blood sugar diagnostic Strp by Other route Two (2) times a day. 100 each 11   ??? clopidogrel (PLAVIX) 75 mg tablet Take 1 tablet (75 mg total) by mouth daily. 90 tablet 3   ??? glipiZIDE (GLUCOTROL) 5 MG tablet Take 1 tablet (5 mg total) by mouth once daily. 90 tablet 3   ??? lancets Misc Use lancet for checking blood sugar two times a day 100 each 11   ??? latanoprost (XALATAN) 0.005 % ophthalmic solution Administer 1 drop to both eyes nightly. 2.5 mL 11   ??? ledipasvir 90 mg-sofosbuvir 400 mg (HARVONI) tablet Take 1 tablet by mouth daily. 28 tablet 2   ??? metFORMIN (GLUCOPHAGE) 1000 MG tablet Take 1 tablet (1,000 mg total) by mouth 2 (two) times a day with meals. (Patient not taking: Reported on 03/11/2018) 180 tablet 3   ??? oxyCODONE (ROXICODONE) 5 MG immediate release tablet Take 1-2 tabs every 4 hours for pain as needed. Do not drive while taking this medication (Patient not taking: Reported on 03/11/2018) 180 tablet 0   ??? simvastatin (ZOCOR) 10 MG tablet Take 1 tablet (10 mg total) by mouth nightly. (Patient not taking: Reported on 03/11/2018) 90 tablet 3     No current facility-administered medications for this visit.      Advises he his not taking DM medications as he only takes based on his BS readings. Average sugar readings have been 120's range.     ALLERGIES:    Patient has no known allergies.    SOCIAL HISTORY  Social History     Socioeconomic History   ??? Marital status: Divorced     Spouse name: None   ??? Number of children: None   ??? Years of education: None   ??? Highest education level: None   Occupational History   ??? Occupation: Curator   Social Needs   ??? Financial resource strain: None   ??? Food insecurity:     Worry: None     Inability: None   ??? Transportation needs:     Medical: None     Non-medical: None  Tobacco Use   ??? Smoking status: Current Some Day Smoker     Packs/day: 0.25     Years: 40.00     Pack years: 10.00     Types: Cigarettes   ??? Smokeless tobacco: Never Used   ??? Tobacco comment: three to four cigs daily.    Substance and Sexual Activity   ??? Alcohol use: Not Currently     Alcohol/week: 2.0 standard drinks     Types: 2 Shots of liquor per week     Comment: 3 beers daily.  No alcohol use since May    ??? Drug use: Yes     Frequency: 3.0 times per week     Types: Crack cocaine, Marijuana, Oxycodone     Comment: street oxycodone 1-2 times per day, cocaine once every 2 weeks, marijuana use once daily   ??? Sexual activity: Not Currently     Partners: Female   Lifestyle   ??? Physical activity:     Days per week: None     Minutes per session: None   ??? Stress: None   Relationships   ??? Social connections:     Talks on phone: None     Gets together: None     Attends religious service: None     Active member of club or organization: None     Attends meetings of clubs or organizations: None     Relationship status: None   Other Topics Concern   ??? Do you use sunscreen? No   ??? Tanning bed use? No   ??? Are you easily burned? No   ??? Excessive sun exposure? No   ??? Blistering sunburns? No   Social History Narrative    Has 2 daughters, lives in [place], sometimes in a shelter        FAMILY HISTORY:    family history includes Cancer in his father and sister; Diabetes in his mother; No Known Problems in his brother, maternal aunt, maternal grandfather, maternal grandmother, maternal uncle, paternal aunt, paternal grandfather, paternal grandmother, and paternal uncle.      VITAL SIGNS:    BP 107/71  - Pulse 60  - Temp 36.9 ??C (98.4 ??F) (Oral)  - Resp 18  - Ht 185.4 cm (6' 0.99)  - SpO2 99%  - BMI 18.33 kg/m?? PHYSICAL EXAM:  General appearance - alert, well appearing, and in no distress and oriented to person, place, and time. Slender gentleman. Ambulating with walker.   Mental status - depressed mood  Neurological - screening mental status exam normal  Rest of PE deferred.     Laboratory studies:   Results for orders placed or performed in visit on 03/11/18   Urinalysis   Result Value Ref Range    Color, UA Yellow     Clarity, UA Hazy     Specific Gravity, UA 1.026 1.003 - 1.030    pH, UA 5.5 5.0 - 9.0    Leukocyte Esterase, UA Negative Negative    Nitrite, UA Negative Negative    Protein, UA Trace (A) Negative    Glucose, UA Negative Negative    Ketones, UA Negative Negative    Urobilinogen, UA 4.0 mg/dL (A) 0.2 mg/dL, 1.0 mg/dL    Bilirubin, UA Negative Negative    Blood, UA Negative Negative    RBC, UA 1 <=3 /HPF    WBC, UA 1 <=2 /HPF    Squam Epithel, UA <1 0 - 5 /HPF    Bacteria, UA None Seen  None Seen /HPF    Ca Oxalate Crystal, UA Rare /HPF    Mucus, UA Occasional (A) None Seen /HPF   Hepatic Function Panel   Result Value Ref Range    Albumin 4.2 3.5 - 5.0 g/dL    Total Protein 7.8 6.5 - 8.3 g/dL    Total Bilirubin 0.5 0.0 - 1.2 mg/dL    Bilirubin, Direct 0.98 0.00 - 0.40 mg/dL    AST 20 19 - 55 U/L    ALT 18 (L) 19 - 72 U/L    Alkaline Phosphatase 88 38 - 126 U/L   Basic metabolic panel   Result Value Ref Range    Sodium 139 135 - 145 mmol/L    Potassium 4.0 3.5 - 5.0 mmol/L    Chloride 105 98 - 107 mmol/L    CO2 26.0 22.0 - 30.0 mmol/L    Anion Gap 8 (L) 9 - 15 mmol/L    BUN 12 7 - 21 mg/dL    Creatinine 1.19 (L) 0.70 - 1.30 mg/dL    BUN/Creatinine Ratio 18     EGFR CKD-EPI Non-African American, Male >90 >=60 mL/min/1.11m2    EGFR CKD-EPI African American, Male >90 >=60 mL/min/1.2m2    Glucose 91 65 - 179 mg/dL    Calcium 9.3 8.5 - 14.7 mg/dL   CBC w/ Differential   Result Value Ref Range    WBC 3.9 (L) 4.5 - 11.0 10*9/L    RBC 4.95 4.50 - 5.90 10*12/L    HGB 14.4 13.5 - 17.5 g/dL    HCT 82.9 56.2 - 13.0 % MCV 88.3 80.0 - 100.0 fL    MCH 29.2 26.0 - 34.0 pg    MCHC 33.0 31.0 - 37.0 g/dL    RDW 86.5 78.4 - 69.6 %    MPV 11.1 (H) 7.0 - 10.0 fL    Platelet 163 150 - 440 10*9/L    Neutrophils % 44.1 %    Lymphocytes % 37.9 %    Monocytes % 6.6 %    Eosinophils % 7.8 %    Basophils % 1.6 %    Absolute Neutrophils 1.7 (L) 2.0 - 7.5 10*9/L    Absolute Lymphocytes 1.5 1.5 - 5.0 10*9/L    Absolute Monocytes 0.3 0.2 - 0.8 10*9/L    Absolute Eosinophils 0.3 0.0 - 0.4 10*9/L    Absolute Basophils 0.1 0.0 - 0.1 10*9/L    Large Unstained Cells 2 0 - 4 %        ASSESSMENT/PLAN:      1. Chronic hepatitis C, treatment naive with advanced fibrosis:  This is a 62 y.o. male with a PMH notable for PAD s/p Left AKA, DM, HLD, polysubstance abuse, and hepatitis C who presents for hepatitis C genotype 1B, diagnosed in January 2019 as part of evaluation of mild transaminase elevations. He presents today for treatment follow up ~ TW #8.  He has adhered to taking Harvoni one tablet daily. Denies having missed any doses of DAA treatment. Experiencing minor headaches which are well controlled with Tylenol prn as well as increased water intake. Denies any alcohol use since starting treatment. No use of anti acid medications.     ~ HCV safety labs ordered.   ~ Continue Harvoni one tablet daily. Avoid missing any doses.   ~ Continues to increase water intake.   ~ No alcohol use.   ~ Exchange toothbrushes and razors frequently throughout treatment.     2. History of moderate to heavy alcohol use:  No use since May 2019. Congratulated.     3. Illicit drug use: Continued use as noted under Social History.     4. Staging of Fibrosis: Fibroscan reading consistent with F3. Will warrant continued HCC screening every six months.     5.  Unintentional Weight loss: Significant weight loss (40-50lbs, confirmed in Epic) over the past 5 years that is not entirely explained by having undergone above the knee amputation in 2014. Orders have been previously placed for both EGD and Colonoscopy. Pending being scheduled. Awaiting cardiac clearance involving antiplatelet therapy.     6. Chronic constipation: Suggest trial of Miralax OTC as directed. Able to titrate once daily to bid. Pending endoscopy findings.     7.  Lower back pain: UA ordered. Instructed to follow up with his PCP.     8. + immunity hepatitis A.     9.  Immunity hepatitis B: #1 HEPLISAV vaccine administered. #2 vaccine next visit.     10. History of Tobacco abuse. In the setting of unintentional weight loss. Recommend CT scan of chest to exclude underlying malignancy.      All patient's and his daughter's questions were answered to their satisfaction during office visit today.     Rodman Key, DNP, FNP-BC  [facility]  [address] [address]  [place] [address]  Phone [phone]

## 2018-03-13 LAB — HEPATITIS C RNA, QUANTITATIVE, PCR: HCV RNA: NOT DETECTED

## 2018-03-13 LAB — HCV RNA: Hepatitis C virus RNA:PrThr:Pt:Ser/Plas:Ord:Probe.amp.tar: NOT DETECTED

## 2018-03-14 NOTE — Unmapped (Signed)
Left message on machine to call back  

## 2018-03-19 NOTE — Unmapped (Addendum)
Left message on machine to call back.    ----- Message from [facility], [place] sent at 03/13/2018  4:43 PM EDT -----  Regarding: HCV RNA   Please let Mr. Reister know that his HCV RNA level at this point in his treatment is not detected indicating a positive response with his treatment so far. He needs to follow up as scheduled and continue treatment.    Dawn

## 2018-03-24 NOTE — Unmapped (Addendum)
Patient returned my call. Spoke with him about the medication he is taking for Hep C is working and the blood test shows the virus is not detected. Explained to him that this means the medication is working. Instructed him to keep taking the medication. He replied he is on his last bottle. Reminded patient of appointment in Sept.     ----- Message from Fawn Kirk, FNP sent at 03/13/2018  4:43 PM EDT -----  Regarding: HCV RNA   Please let Mr. Telford know that his HCV RNA level at this point in his treatment is not detected indicating a positive response with his treatment so far. He needs to follow up as scheduled and continue treatment.    Dawn

## 2018-03-28 NOTE — Unmapped (Signed)
Follow-Up Counseling for HCV Treatment    Regimen: Harvoni (ledipasvir/sofosbuvir 90/400mg ) x 12 weeks  Start Date: 01/15/18  Completed Treatment Week #10    Pharmacy: [facility] [phone]     Following topics were reviewed during the phone call: Daughter Randy Bray provided information.   Patient Counseling    Counseled the patient on the following:  doses and administration discussed, possible adverse effects and management discussed, possible drug and prescription drug interactions discussed, possible drug and OTC drug and food interactions discussed, lab monitoring and follow-up discussed, therapeutic rationale discussed, adherence and missed doses discussed, pharmacy contact information discussed         1. Medication administration - Takes Harvoni every morning when he wakes up.     2. Importance of adherence -   Medication Adherence    Patient reported X missed doses in the last month:  0  Specialty Medication:  Harvoni  Patient is on additional specialty medications:  No  Patient is on more than two specialty medications:  No  Any gaps in refill history greater than 2 weeks in the last 3 months:  no  Demonstrates understanding of importance of adherence:  yes  Informant:  child/children  Provider-estimated medication adherence level:  90-100%  Patient is at risk for Non-Adherence:  No  Reasons for non-adherence:  no problems identified  Adherence tools used:  directed education  Support network for adherence:  family member       Daughter was unable to provide pill count but stated to her knowledge he has not missed a day.     3. Side effects - No new side effects to report. Randy Bray stated her father has been feeling well and he has not reported any side effects.   Adverse Effects    *All other systems reviewed and are negative         4. Drug-drug interaction - Randy Bray stated her father has not started any new medications and has remained abstinent from alcohol.  Drug Interactions    Drug interactions evaluated:  yes  Clinically relevant drug interactions identified:  yes   Interactions list:  Simvastatin: simvastatin levels may be increased    Drug management plan:  Advised pt of potential increased levels of simvastatin if taken with Harvoni. Advised pt to monitor for potential increased side effects from statin (i.e. Muscle ache, dark urine etc) and to notify PCP of these adverse events.             5. Follow up - Has follow up appointment scheduled in HCV treatment clinic o n9/3/19 @ 0930 with Randy Shark, DNP. Randy Bray stated she will pass along the apt time to her sister who will transport her father to his apt. Informed patient that there is still a small chance of relapse after finishing the treatment. Stressed importance of follow up 3 months post treatment to assess for cure.      All questions were answered.      Randy Limber RN, [facility]   Pharmacy Adult GI Medicine  [facility]  [address]   [place], [place] [address]  [phone]    March 28, 2018 11:05 AM

## 2018-05-21 ENCOUNTER — Ambulatory Visit: Admit: 2018-05-21 | Discharge: 2018-05-22 | Payer: MEDICARE | Attending: Family | Primary: Family

## 2018-05-21 DIAGNOSIS — R05 Cough: Secondary | ICD-10-CM

## 2018-05-21 DIAGNOSIS — B182 Chronic viral hepatitis C: Principal | ICD-10-CM

## 2018-05-21 DIAGNOSIS — Z720 Tobacco use: Secondary | ICD-10-CM

## 2018-05-21 DIAGNOSIS — R634 Abnormal weight loss: Secondary | ICD-10-CM

## 2018-05-21 DIAGNOSIS — Z23 Encounter for immunization: Secondary | ICD-10-CM

## 2018-05-21 LAB — CBC W/ AUTO DIFF
BASOPHILS RELATIVE PERCENT: 1.4 %
EOSINOPHILS ABSOLUTE COUNT: 0.2 10*9/L (ref 0.0–0.4)
EOSINOPHILS RELATIVE PERCENT: 3.5 %
HEMATOCRIT: 41.1 % (ref 41.0–53.0)
HEMOGLOBIN: 13.5 g/dL (ref 13.5–17.5)
LYMPHOCYTES ABSOLUTE COUNT: 1.7 10*9/L (ref 1.5–5.0)
LYMPHOCYTES RELATIVE PERCENT: 38.3 %
MEAN CORPUSCULAR HEMOGLOBIN CONC: 32.8 g/dL (ref 31.0–37.0)
MEAN CORPUSCULAR HEMOGLOBIN: 29.4 pg (ref 26.0–34.0)
MEAN CORPUSCULAR VOLUME: 89.7 fL (ref 80.0–100.0)
MEAN PLATELET VOLUME: 11 fL — ABNORMAL HIGH (ref 7.0–10.0)
MONOCYTES ABSOLUTE COUNT: 0.3 10*9/L (ref 0.2–0.8)
MONOCYTES RELATIVE PERCENT: 6.2 %
NEUTROPHILS ABSOLUTE COUNT: 2.2 10*9/L (ref 2.0–7.5)
NEUTROPHILS RELATIVE PERCENT: 49.2 %
PLATELET COUNT: 211 10*9/L (ref 150–440)
RED BLOOD CELL COUNT: 4.58 10*12/L (ref 4.50–5.90)
RED CELL DISTRIBUTION WIDTH: 14.2 % (ref 12.0–15.0)
WBC ADJUSTED: 4.4 10*9/L — ABNORMAL LOW (ref 4.5–11.0)

## 2018-05-21 LAB — BASIC METABOLIC PANEL
ANION GAP: 11 mmol/L (ref 7–15)
BLOOD UREA NITROGEN: 17 mg/dL (ref 7–21)
CALCIUM: 9.3 mg/dL (ref 8.5–10.2)
CHLORIDE: 103 mmol/L (ref 98–107)
CO2: 26 mmol/L (ref 22.0–30.0)
CREATININE: 0.82 mg/dL (ref 0.70–1.30)
EGFR CKD-EPI AA MALE: >90 mL/min/{1.73_m2} (ref >=60)
EGFR CKD-EPI NON-AA MALE: >90 mL/min/{1.73_m2} (ref >=60)
GLUCOSE RANDOM: 84 mg/dL (ref 65–99)
POTASSIUM: 4.4 mmol/L (ref 3.5–5.0)

## 2018-05-21 LAB — HEPATIC FUNCTION PANEL
ALBUMIN: 4.4 g/dL (ref 3.5–5.0)
ALKALINE PHOSPHATASE: 89 U/L (ref 38–126)
AST (SGOT): 22 U/L (ref 19–55)
BILIRUBIN DIRECT: 0.2 mg/dL (ref 0.00–0.40)

## 2018-05-21 LAB — CALCIUM: Calcium:MCnc:Pt:Ser/Plas:Qn:: 9.3

## 2018-05-21 LAB — PROTIME: Lab: 14.1 — ABNORMAL HIGH

## 2018-05-21 LAB — AFP-TUMOR MARKER: Alpha-1-Fetoprotein.tumor marker:MCnc:Pt:Ser/Plas:Qn:: 4

## 2018-05-21 LAB — ALBUMIN: Albumin:MCnc:Pt:Ser/Plas:Qn:: 4.4

## 2018-05-21 LAB — BASOPHILS ABSOLUTE COUNT: Lab: 0.1

## 2018-05-21 NOTE — Unmapped (Signed)
1.  Laboratory studies done today as part of continued hepatitis C care.   2.  Second hepatitis B vaccination administered today.   3.  Office follow up three months.   4.  Order placed for CT scan of chest for lung cancer screening.   5.  Proceed to [facility] in six week for repeat lab work.  6.  Will proceed with getting you scheduled for EGD and colonoscopy as we discussed.   7.  Recommend quitting smoking. No illicit drug use.   8.  Flu vaccine administered today.

## 2018-05-22 NOTE — Unmapped (Signed)
[facility]    [name], M.D.  Professor of Medicine  Director, [facility]  [place] [facility] [place] [facility] [place]    [phone]    REFERRING PROVIDER:  Loran Senters, FNP  [address]  [place], [place] [address]    PRIMARY CARE PROVIDER:  Noralyn Pick, FNP    Reason For Office Visit:  Treatment follow up HCV  Harvoni x 12 weeks  Start Date: 01/15/2018 - April 08, 2018  Post TW #6    HISTORY OF PRESENT ILLNESS:   This is a 62 y.o. male with a PMH notable for PAD s/p Left AKA, DM, HLD, polysubstance abuse (continued -cocaine, street oxycodone and MJ), and hepatitis C who presents today for treatment follow up ~ Post TW #6. His HCV RNA level was not detected on 03/11/2018. He adhered to taking one tablet of Harvoni daily. Denies having missed any doses. He avoided any anti acid medications during treatment.     He was noted to have elevated transaminases in 2018 and was found to have hepatitis C, genotype 1B. Before this diagnosis, he had never been told he has abnormal LFTs or any liver issues. He thinks he had jaundice several decades ago. No issues with ascites, confusion, or GI bleeding. He denies any family history of liver disease. He was drinking 24-48 ounces of beer daily prior to initiation of treatment. He drank more heavily in the past and has 2 DUIs. He continues to smoke cigarettes and takes street oxycodone 1-2 times per day when he can get it, cocaine once every 2 weeks, marijuana use once daily.    He presents today with his daughter, Steward Drone. He did experience minor headaches with DAA treatment. He took Tylenol prn. Denies excess of more than 2,000 mg Tylenol total daily. He states his appetite is good but noted continued weight loss.   Weight loss of at least 40-50lb unexplained weight loss over the past 5 or 6 years (some but not all of this is attributable to left AKA). Orders have been placed for EGD and colonoscopy. Per patient and his daughter he has not been scheduled yet. It appears still pending clearance from his cardiologist in reference to chronic antiplatelet therapy.     + cough for the past month. Patient states, I cannot get rid of this cough. + productive cough. Sputum clear to yellow color. Denies fevers or chills. + long standing history of tobacco use since age of ~ 31.     Pertinent data:  - Jan 2019 lab work: Hep C genotype 1B, RNA 829,562 with log 5.68  - July 2018 lab work: Cr 0.6; Bili 0.8, AST 41, ALT 54, Alk P 81    Liver Care:   1. Chronic hepatitis C, treatment naive, genotype 1B  Baseline HCV RNA: 483,984 IU/mL  HCV RNA TW #8 - not detected     2. + immunity hepatitis A.   3. Immunity status hepatitis B: #1 HEPLISAV vaccine administered 03/11/2018, #2 HEPLISAV vaccine administered 05/21/2018  4. Fibroscan: 10.4 kPa/F3  5. HCC screening: MRI of abdomen 01/06/2018 ~No evidence of cirrhosis. Spleen unremarkable. No fluid. + moderate stool burden.     REVIEW OF SYSTEMS:     The balance of 12 systems reviewed is negative except as noted in the HPI.     PAST MEDICAL HISTORY:    Past Medical History:   Diagnosis Date   ??? Diabetes (CMS-HCC) 11/27/2012    Type  II   ??? 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 Neuropathic pain, leg 08/04/2013   ??? PAD (peripheral artery disease) (CMS-HCC)    ??? Tobacco abuse      PAST SURGICAL HISTORY:    Past Surgical History:   Procedure Laterality Date   ??? ANGIOPLASTY FEM POP UNIL [facility] HISTORICAL RESULT)     ??? Left SFA to PT bypass with PTFE  02/2012   ??? PILONIDAL CYST / SINUS EXCISION     ??? PR AMPUTATE THIGH,THRU FEMUR Left 01/29/2013    Procedure: POSS. L. AKA;  Surgeon: Marion Downer, MD;  Location: MAIN OR [facility];  Service: Vascular   ??? PR AMPUTATION LOW LEG THRU TIB/FIB Left 11/28/2012    Procedure: AMPUTA LEG THRU TIBIA & FIBULA;  Surgeon: Marion Downer, MD;  Location: MAIN OR [facility];  Service: Vascular   ??? PR DEBRIDEMENT, SKIN, SUB-Q TISSUE,MUSCLE,BONE,=<20 SQ CM Left 01/29/2013    Procedure: DEBRIDEMENT; SKIN, SUBCUTANEOUS TISSUE, MUSCLE, & BONE;  Surgeon: Marion Downer, MD;  Location: MAIN OR [facility];  Service: Vascular   ??? PR RE-AMPUTATION LOWER LEG Left 01/29/2013    Procedure: REVISION OF L. BKA;  Surgeon: Marion Downer, MD;  Location: MAIN OR [facility];  Service: Vascular     MEDICATIONS:  Current Outpatient Medications   Medication Sig Dispense Refill   ??? acetaminophen (TYLENOL) 325 MG tablet Take 650MG  PO every 6 hours as needed for pain. Do not exceed 3G daily 30 tablet 0   ??? aspirin (ADULT LOW DOSE ASPIRIN) 81 MG tablet Take 81 mg by mouth daily.      ??? blood sugar diagnostic Strp by Other route Two (2) times a day. 100 each 11   ??? clopidogrel (PLAVIX) 75 mg tablet Take 1 tablet (75 mg total) by mouth daily. 90 tablet 3   ??? glipiZIDE (GLUCOTROL) 5 MG tablet Take 1 tablet (5 mg total) by mouth once daily. 90 tablet 3   ??? lancets Misc Use lancet for checking blood sugar two times a day 100 each 11   ??? latanoprost (XALATAN) 0.005 % ophthalmic solution Administer 1 drop to both eyes nightly. 2.5 mL 11   ??? ledipasvir 90 mg-sofosbuvir 400 mg (HARVONI) tablet Take 1 tablet by mouth daily. 28 tablet 2   ??? metFORMIN (GLUCOPHAGE) 1000 MG tablet Take 1 tablet (1,000 mg total) by mouth 2 (two) times a day with meals. 180 tablet 3   ??? simvastatin (ZOCOR) 10 MG tablet Take 1 tablet (10 mg total) by mouth nightly. 90 tablet 3   ??? oxyCODONE (ROXICODONE) 5 MG immediate release tablet Take 1-2 tabs every 4 hours for pain as needed. Do not drive while taking this medication (Patient not taking: Reported on 03/11/2018) 180 tablet 0     No current facility-administered medications for this visit.      Advises he his not taking DM medications as he only takes based on his BS readings. Average sugar readings have been 120's range.     ALLERGIES:    Patient has no known allergies.    SOCIAL HISTORY  Social History     Socioeconomic History   ??? Marital status: Divorced     Spouse name: None   ??? Number of children: None   ??? Years of education: None   ??? Highest education level: None   Occupational History   ??? Occupation: Curator   Social Needs   ??? Financial resource strain: None   ???  Food insecurity:     Worry: None     Inability: None   ??? Transportation needs:     Medical: None     Non-medical: None   Tobacco Use   ??? Smoking status: Current Some Day Smoker     Packs/day: 0.25     Years: 40.00     Pack years: 10.00     Types: Cigarettes   ??? Smokeless tobacco: Never Used   ??? Tobacco comment: three to four cigs daily.    Substance and Sexual Activity   ??? Alcohol use: Not Currently     Alcohol/week: 2.0 standard drinks     Types: 2 Cans of beer per week     Comment: Drinking 16 ounce beer every two three days   ??? Drug use: Not Currently     Frequency: 3.0 times per week     Types: Crack cocaine, Marijuana, Oxycodone     Comment: street oxycodone 1-2 times per day, cocaine once every 2 weeks, marijuana use once daily   ??? Sexual activity: Not Currently     Partners: Female   Lifestyle   ??? Physical activity:     Days per week: None     Minutes per session: None   ??? Stress: None   Relationships   ??? Social connections:     Talks on phone: None     Gets together: None     Attends religious service: None     Active member of club or organization: None     Attends meetings of clubs or organizations: None     Relationship status: None   Other Topics Concern   ??? Do you use sunscreen? No   ??? Tanning bed use? No   ??? Are you easily burned? No   ??? Excessive sun exposure? No   ??? Blistering sunburns? No   Social History Narrative    Has 2 daughters, lives in [place], sometimes in a shelter        FAMILY HISTORY:    family history includes Cancer in his father and sister; Diabetes in his mother; No Known Problems in his brother, maternal aunt, maternal grandfather, maternal grandmother, maternal uncle, paternal aunt, paternal grandfather, paternal grandmother, and paternal uncle.      VITAL SIGNS:    BP 106/71  - Pulse 60  - Temp 36.8 ??C (98.2 ??F)  - Resp 20  - Ht 182.9 cm (6')  - Wt 62.5 kg (137 lb 11.2 oz)  - SpO2 97%  - BMI 18.68 kg/m??     PHYSICAL EXAM:  General appearance - Alert, well appearing, and in no distress and oriented to person, place, and time. Slender gentleman. Ambulating with walker. +evidence of temporal wasting.  Chest - clear to auscultation, no wheezes, rales or rhonchi, symmetric air entry  Heart - normal rate, regular rhythm, normal S1, S2, no murmurs, rubs, clicks or gallops  Abd: deferred examine   Mental status - depressed mood  Neurological - screening mental status exam normal  Rest of PE deferred.     Laboratory studies:   Results for orders placed or performed in visit on 05/21/18   AFP non-maternal tumor marker   Result Value Ref Range    AFP-Tumor Marker 4 <8 ng/mL   PT-INR   Result Value Ref Range    PT 14.1 (H) 10.2 - 13.1 sec    INR 1.22    Hepatic Function Panel   Result Value Ref Range  Albumin 4.4 3.5 - 5.0 g/dL    Total Protein 8.1 6.5 - 8.3 g/dL    Total Bilirubin 0.7 0.0 - 1.2 mg/dL    Bilirubin, Direct 1.61 0.00 - 0.40 mg/dL    AST 22 19 - 55 U/L    ALT 18 (L) 19 - 72 U/L    Alkaline Phosphatase 89 38 - 126 U/L   Basic metabolic panel   Result Value Ref Range    Sodium 140 135 - 145 mmol/L    Potassium 4.4 3.5 - 5.0 mmol/L    Chloride 103 98 - 107 mmol/L    CO2 26.0 22.0 - 30.0 mmol/L    Anion Gap 11 7 - 15 mmol/L    BUN 17 7 - 21 mg/dL    Creatinine 0.96 0.45 - 1.30 mg/dL    BUN/Creatinine Ratio 21     EGFR CKD-EPI Non-African American, Male >90 >=60 mL/min/1.45m2    EGFR CKD-EPI African American, Male >90 >=60 mL/min/1.10m2    Glucose 84 65 - 99 mg/dL    Calcium 9.3 8.5 - 40.9 mg/dL   CBC w/ Differential   Result Value Ref Range    WBC 4.4 (L) 4.5 - 11.0 10*9/L    RBC 4.58 4.50 - 5.90 10*12/L    HGB 13.5 13.5 - 17.5 g/dL    HCT 81.1 91.4 - 78.2 %    MCV 89.7 80.0 - 100.0 fL    MCH 29.4 26.0 - 34.0 pg    MCHC 32.8 31.0 - 37.0 g/dL    RDW 95.6 21.3 - 08.6 %    MPV 11.0 (H) 7.0 - 10.0 fL    Platelet 211 150 - 440 10*9/L    Neutrophils % 49.2 %    Lymphocytes % 38.3 %    Monocytes % 6.2 %    Eosinophils % 3.5 %    Basophils % 1.4 %    Absolute Neutrophils 2.2 2.0 - 7.5 10*9/L    Absolute Lymphocytes 1.7 1.5 - 5.0 10*9/L    Absolute Monocytes 0.3 0.2 - 0.8 10*9/L    Absolute Eosinophils 0.2 0.0 - 0.4 10*9/L    Absolute Basophils 0.1 0.0 - 0.1 10*9/L    Large Unstained Cells 1 0 - 4 %        ASSESSMENT/PLAN:      1. Chronic hepatitis C, treatment naive with advanced fibrosis:  This is a 62 y.o. AA male with a PMH notable for PAD s/p Left AKA, DM, HLD, polysubstance abuse, and hepatitis C who presents today for treatment follow up ~ Post TW #6.  He adhered to taking one tablet of Harvoni daily. Denies having missed any doses. He is genotype 1B, diagnosed in January 2019 as part of evaluation of mild transaminase elevations. He did experiencing minor headaches which were well controlled with Tylenol prn as well as increased water intake. Denies any alcohol use since starting treatment. No use of anti acid medications.     ~ HCV safety and MELD labs ordered.   ~ Office follow up three months allowing for closer monitoring of unintentional weight loss.   ~ HCV RNA level ordered for [facility] in six weeks for SVR confirmation.     2. History of moderate to heavy alcohol use: No use during treatment. Since completion he has resumed use. Drinking 16 ounce beer every two three days.     3. Illicit drug use: Continued use as noted under history section.     4. Staging of  Fibrosis: Fibroscan reading consistent with F3. Will warrant continued HCC screening every six months.     5.  Unintentional Weight loss: Significant weight loss (40-50lbs, confirmed in Epic) over the past 5 years that is not entirely explained by having undergone above the knee amputation in 2014. Instructed patient and his daughter of the need for clearance to be obtained surrounding him being on anti platelet therapy.  He warrants having EGD and Colonoscopy.     6.  + immunity hepatitis A.     7.  Immunity hepatitis B: Vitamin B series completed.     8. History of Tobacco abuse. In the setting of unintentional weight loss.  CT scan of chest ordered to exclude underlying malignancy.     9.  Flu vaccine administered today.      All patient's and his daughter's questions were answered to their satisfaction during office visit today.     Rodman Key, DNP, FNP-BC  [facility]  [address]  [place] [address]  Phone [phone]

## 2018-05-23 ENCOUNTER — Ambulatory Visit: Admit: 2018-05-23 | Discharge: 2018-05-24 | Payer: MEDICARE

## 2018-05-23 DIAGNOSIS — R634 Abnormal weight loss: Principal | ICD-10-CM

## 2018-05-23 DIAGNOSIS — Z720 Tobacco use: Secondary | ICD-10-CM

## 2018-05-23 DIAGNOSIS — R05 Cough: Secondary | ICD-10-CM

## 2018-05-23 LAB — HCV RNA(IU): Hepatitis C virus RNA:ACnc:Pt:Ser/Plas:Qn:Probe.amp.tar: 0

## 2018-05-23 LAB — HEPATITIS C RNA, QUANTITATIVE, PCR: HCV RNA: NOT DETECTED

## 2018-05-23 NOTE — Unmapped (Addendum)
-----   Message from [facility], [place] sent at 05/23/2018 12:58 PM EDT -----  Regarding: CT scan of chest  Please let Mr. Randy Bray or his daughter know CT scan of chest revealed one small pulmonary nodule and emphysema. Given size of pulmonary nodule recommend repeating CT scan of chest one year. Findings of CT scan do not indicate an underlying reason or suspicion for his significant weight loss. I would strongly suggest he follow up with provider prescribing Plavix as it seems to be hold up why he has not been scheduled for endoscopy evaluation yet. We did discuss this when I saw him and his daughter.    Dawn   ------------------------------------------------------------------------------------------------------------  .05/23/18 13:21 Left patient a message on voicemail to call me back.

## 2018-05-26 NOTE — Unmapped (Signed)
??    February 05, 2018     Randy Bray  [address]  [place] [address]  ??  ??  ??  ??  ??  ??  Dear [name],  ??  ??  [name], MD has ordered:COLONOSCOPY AND UPPER ENDOSCOPY   ??  Your chart indicates you take: PLAVIX  ??  Before your procedure can be scheduled we need to know whether or not you could stop or hold PLAVIX.  ??  We have sent a coagulation management form to : [name] FNP X 3  , thus far no response.  Please utilize the enclosed form to contact your PCP's office to ask obtain the answer to this question .  ??  Once you have the answer, please call to get your procedure scheduled.  ??  Please note, do NOT stop taking your blood thinning medication until the procedure date is established. You are NOT yet scheduled.   ??  Thank you,  Danne Baxter [facility]  Triage Nurse  [facility]  [phone]  ??  ??  ??  ??  ??  SEE OTHER SIDE

## 2018-05-26 NOTE — Unmapped (Addendum)
-----   Message from [facility], [place] sent at 05/23/2018 12:58 PM EDT -----  Regarding: CT scan of chest  Please let Mr. Randy Bray or his daughter know CT scan of chest revealed one small pulmonary nodule and emphysema. Given size of pulmonary nodule recommend repeating CT scan of chest one year. Findings of CT scan do not indicate an underlying reason or suspicion for his significant weight loss. I would strongly suggest he follow up with provider prescribing Plavix as it seems to be hold up why he has not been scheduled for endoscopy evaluation yet. We did discuss this when I saw him and his daughter.    Dawn   -------------------------------------------------------------------------------------------------------------  05/26/18 12:42 I called the patient's phone, and both his daughter's phones and no one answered, unable to leave a message.

## 2018-05-26 NOTE — Unmapped (Addendum)
-----   Message from [facility], [place] sent at 05/23/2018 12:58 PM EDT -----  Regarding: CT scan of chest  Please let Randy Bray or his daughter know CT scan of chest revealed one small pulmonary nodule and emphysema. Given size of pulmonary nodule recommend repeating CT scan of chest one year. Findings of CT scan do not indicate an underlying reason or suspicion for his significant weight loss. I would strongly suggest he follow up with provider prescribing Plavix as it seems to be hold up why he has not been scheduled for endoscopy evaluation yet. We did discuss this when I saw him and his daughter.    Dawn   -------------------------------------------------------------------------------------------------------------05/26/18 13:13 Spoke with the patient's daughter Randy Bray, let her know the medicine her dad is taking for rid him of the HCV is working. Also gave her the CT results and recommendation to repeat the CT in one year. She verbalizes understanding.     I asked her what happened with the EGD and Colon not being ordered. She said they want it in [place], there was some talk back and forth, then she did not hear anything else. I let her know I am going to look into this and get them ordered.

## 2018-05-28 NOTE — Unmapped (Signed)
Verbal order given to hold plavix x 5 days prior to EGD and colonoscopy.

## 2018-06-12 NOTE — Unmapped (Signed)
Assessment and Plan:     Randy Bray was seen today for diabetes and hyperlipidemia.    Diagnoses and all orders for this visit:    Diabetes mellitus type 2 without retinopathy (CMS-HCC)  HGB A1c 5.5%  DM well controlled   Continue glipizide and metformin at current dosage.   -     POCT glycosylated hemoglobin (Hb A1C)    Hyperlipidemia, unspecified hyperlipidemia type  Continue simvastatin. Counseled regarding low cholesterol diet.     Tobacco abuse  Reinforced smoking cessation.     Erectile dysfunction, unspecified erectile dysfunction type  Counseled regarding effects of smoking on vascular perfusion. No contraindications for use of low dose sildenafil. Will trial med. Counseled on medication directions, side effects.   -     sildenafil (VIAGRA) 25 MG tablet; Take 1 tablet (25 mg total) by mouth daily as needed for erectile dysfunction.        HPI:      Randy Bray  is here for   Chief Complaint   Patient presents with   ??? Diabetes   ??? Hyperlipidemia       Diabetes: Patient presents for follow up of diabetes.  A1C goal is <8.  Diabetes has customarily been at goal .  Current symptoms include: none. Symptoms have stabilized. Patient denies foot ulcerations, hyperglycemia, hypoglycemia , increased appetite, nausea, paresthesia of the feet, polydipsia, polyuria, visual disturbances, vomiting and weight loss. Evaluation to date has included: hemoglobin A1C.  Home sugars: BGs range between 120 and 135. Current treatment: Continued metformin which has been effective and gliziide.  Doing regular exercise: no.       Hyperlipidemia: Patient presents for follow-up of dyslipidemia. Compliance with treatment has been fair. The patient exercises rarely. Patient denies muscle pain associated with His medications. He is sometimes adhering to a low fat/low cholesterol diet.    ED:  Patient states he is having difficulty achieving erection and would like to discuss treatment options.   No history of MI or CVA. He denies angina and cocaine use.        [facility] Components:     Goals    None         I have reviewed and addressed the patient???s adherence and response to prescribed medications. I have identified patient barriers to following the proposed medication and treatment plan, and have noted opportunities to optimize healthy behaviors. I have answered the patient???s questions to satisfaction and the patient voices understanding.          Past Medical/Surgical History:     Past Medical History:   Diagnosis Date   ??? Diabetes (CMS-HCC) 11/27/2012    Type II   ??? 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 Neuropathic pain, leg 08/04/2013   ??? PAD (peripheral artery disease) (CMS-HCC)    ??? Tobacco abuse      Past Surgical History:   Procedure Laterality Date   ??? ANGIOPLASTY FEM POP UNIL [facility] HISTORICAL RESULT)     ??? Left SFA to PT bypass with PTFE  02/2012   ??? PILONIDAL CYST / SINUS EXCISION     ??? PR AMPUTATE THIGH,THRU FEMUR Left 01/29/2013    Procedure: POSS. L. AKA;  Surgeon: Marion Downer, MD;  Location: MAIN OR [facility];  Service: Vascular   ??? PR AMPUTATION LOW LEG THRU TIB/FIB Left 11/28/2012    Procedure: AMPUTA LEG THRU TIBIA & FIBULA;  Surgeon: Marion Downer, MD;  Location: MAIN OR  [facility];  Service: Vascular   ??? PR DEBRIDEMENT, SKIN, SUB-Q TISSUE,MUSCLE,BONE,=<20 SQ CM Left 01/29/2013    Procedure: DEBRIDEMENT; SKIN, SUBCUTANEOUS TISSUE, MUSCLE, & BONE;  Surgeon: Marion Downer, MD;  Location: MAIN OR [facility];  Service: Vascular   ??? PR RE-AMPUTATION LOWER LEG Left 01/29/2013    Procedure: REVISION OF L. BKA;  Surgeon: Marion Downer, MD;  Location: MAIN OR [facility];  Service: Vascular       Family History:     Family History   Problem Relation Age of Onset   ??? Diabetes Mother    ??? Cancer Father         lung   ??? Cancer Sister         pancreatic   ??? No Known Problems Brother    ??? No Known Problems Maternal Aunt    ??? No Known Problems Maternal Uncle    ??? No Known Problems Paternal Aunt    ??? No Known Problems Paternal Uncle    ??? No Known Problems Maternal Grandmother    ??? No Known Problems Maternal Grandfather    ??? No Known Problems Paternal Grandmother    ??? No Known Problems Paternal Grandfather    ??? Amblyopia Neg Hx    ??? Blindness Neg Hx    ??? Cataracts Neg Hx    ??? Glaucoma Neg Hx    ??? Hypertension Neg Hx    ??? Macular degeneration Neg Hx    ??? Retinal detachment Neg Hx    ??? Strabismus Neg Hx    ??? Stroke Neg Hx    ??? Thyroid disease Neg Hx        Social History:     Social History     Socioeconomic History   ??? Marital status: Divorced     Spouse name: Not on file   ??? Number of children: Not on file   ??? Years of education: Not on file   ??? Highest education level: Not on file   Occupational History   ??? Occupation: Curator   Social Needs   ??? Financial resource strain: Not on file   ??? Food insecurity:     Worry: Not on file     Inability: Not on file   ??? Transportation needs:     Medical: Not on file     Non-medical: Not on file   Tobacco Use   ??? Smoking status: Current Some Day Smoker     Packs/day: 0.25     Years: 40.00     Pack years: 10.00     Types: Cigarettes   ??? Smokeless tobacco: Never Used   ??? Tobacco comment: three to four cigs daily.    Substance and Sexual Activity   ??? Alcohol use: Not Currently     Alcohol/week: 2.0 standard drinks     Types: 2 Cans of beer per week     Comment: Drinking 16 ounce beer every two three days   ??? Drug use: Not Currently     Frequency: 3.0 times per week     Types: Crack cocaine, Marijuana, Oxycodone     Comment: street oxycodone 1-2 times per day, cocaine once every 2 weeks, marijuana use once daily   ??? Sexual activity: Not Currently     Partners: Female   Lifestyle   ??? Physical activity:     Days per week: Not on file     Minutes per session: Not on file   ??? Stress: Not on file  Relationships   ??? Social connections:     Talks on phone: Not on file     Gets together: Not on file     Attends religious service: Not on file     Active member of club or organization: Not on file     Attends meetings of clubs or organizations: Not on file     Relationship status: Not on file   Other Topics Concern   ??? Do you use sunscreen? No   ??? Tanning bed use? No   ??? Are you easily burned? No   ??? Excessive sun exposure? No   ??? Blistering sunburns? No   Social History Narrative    Has 2 daughters, lives in [place], sometimes in a shelter        Allergies:     Patient has no known allergies.    Current Medications:     Current Outpatient Medications   Medication Sig Dispense Refill   ??? acetaminophen (TYLENOL) 325 MG tablet Take 650MG  PO every 6 hours as needed for pain. Do not exceed 3G daily 30 tablet 0   ??? aspirin (ADULT LOW DOSE ASPIRIN) 81 MG tablet Take 81 mg by mouth daily.      ??? blood sugar diagnostic Strp by Other route Two (2) times a day. 100 each 11   ??? clopidogrel (PLAVIX) 75 mg tablet Take 1 tablet (75 mg total) by mouth daily. 90 tablet 3   ??? glipiZIDE (GLUCOTROL) 5 MG tablet Take 1 tablet (5 mg total) by mouth once daily. 90 tablet 3   ??? lancets Misc Use lancet for checking blood sugar two times a day 100 each 11   ??? latanoprost (XALATAN) 0.005 % ophthalmic solution Administer 1 drop to both eyes nightly. 2.5 mL 11   ??? ledipasvir 90 mg-sofosbuvir 400 mg (HARVONI) tablet Take 1 tablet by mouth daily. 28 tablet 2   ??? metFORMIN (GLUCOPHAGE) 1000 MG tablet Take 1 tablet (1,000 mg total) by mouth 2 (two) times a day with meals. 180 tablet 3   ??? oxyCODONE (ROXICODONE) 5 MG immediate release tablet Take 1-2 tabs every 4 hours for pain as needed. Do not drive while taking this medication (Patient not taking: Reported on 03/11/2018) 180 tablet 0   ??? simvastatin (ZOCOR) 10 MG tablet Take 1 tablet (10 mg total) by mouth nightly. 90 tablet 3     No current facility-administered medications for this visit.        Health Maintenance:     Health Maintenance Summary w/Most Recent Date       Status Date      DTaP/Tdap/Td Vaccines Overdue 01/18/1967     FOBT/FIT Overdue 01/17/2006 Zoster Vaccines Overdue 01/17/2006     Retinal Eye Exam Overdue 03/27/2018      Done 03/27/2017 SmartData: OPHTH FUNDUS OD PERIPHERY     Done 03/27/2017 SmartData: OPHTH FUNDUS OS PERIPHERY     Done 03/27/2017 SmartData: FINDINGS - PE - EYES - FUNDUSCOPIC - PERIPHERY - RIGHT PERIPHERY NORMAL     Done 03/27/2017 SmartData: FINDINGS - PE - EYES - FUNDUSCOPIC - PERIPHERY - LEFT PERIPHERY NORMAL    Hemoglobin A1c Next Due 06/18/2018      Done 12/17/2017 Registry Metric: Last Hemoglobin A1c Date     Done 12/17/2017 POCT GLYCOSYLATED HEMOGLOBIN (HGB A1C) HGB A1C, RAP/PDS           Done 09/17/2017 HEMOGLOBIN A1C Hemoglobin A1C           Done 03/06/2017  HEMOGLOBIN A1C Hemoglobin A1C           Done 01/21/2014 HEMOGLOBIN A1C Hemoglobin A1C           Patient has more history with this topic...    Urine Albumin/Creatinine Ratio Next Due 09/17/2018      Done 09/17/2017 Registry Metric: [facility] DM AMB LAST URINE MICROALBUMIN TO CREATININE RATIO     Done 09/17/2017 ALBUMIN / CREATININE URINE RATIO Albumin/Creatinine Ratio           Done 01/21/2014 MICROALBUMIN / CREATININE URINE RATIO Microalbumin/Creatinine Ratio           Done 05/31/2011 MALB/CR RATIO/FP Malb/Cr Ratio/FP          Foot Exam Next Due 12/18/2018      Done 12/17/2017 HM DIABETES FOOT EXAM    Serum Creatinine Monitoring Next Due 05/22/2019      Done 05/21/2018 BASIC METABOLIC PANEL Creatinine           Done 03/11/2018 BASIC METABOLIC PANEL Creatinine           Done 12/25/2017 COMPREHENSIVE METABOLIC PANEL Creatinine           Done 03/06/2017 COMPREHENSIVE METABOLIC PANEL Creatinine           Done 01/21/2014 BASIC METABOLIC PANEL     Patient has more history with this topic...    Potassium Monitoring Next Due 05/22/2019      Done 05/21/2018 BASIC METABOLIC PANEL Potassium           Done 03/11/2018 BASIC METABOLIC PANEL Potassium           Done 12/25/2017 COMPREHENSIVE METABOLIC PANEL Potassium           Done 03/06/2017 COMPREHENSIVE METABOLIC PANEL Potassium           Done 01/21/2014 BASIC METABOLIC PANEL Potassium           Patient has more history with this topic...    Pneumococcal Vaccine This plan is no longer active.      Done 05/20/2013 Imm Admin: PNEUMOCOCCAL POLYSACCHARIDE 23    Influenza Vaccine This plan is no longer active.      Done 05/21/2018 Imm Admin: Influenza Vaccine Quad (IIV4 PF) 60mo+ injectable     Done 09/17/2017 Imm Admin: Influenza Vaccine Quad (IIV4 PF) 109mo+ injectable     Done 05/20/2013 Imm Admin: Influenza Vaccine Quad (IIV4 PF) 44mo+ injectable     Done 05/31/2011 Imm Admin: INFLUENZA TIV (TRI) PF (IM)          Immunizations:     Immunization History   Administered Date(s) Administered   ??? HEPB-CPG,ADULT DOSAGE (2 DOSE SCHEDULE)ADJUVANTED, IM 03/11/2018, 05/21/2018   ??? INFLUENZA TIV (TRI) PF (IM) 05/31/2011   ??? Influenza Vaccine Quad (IIV4 PF) 77mo+ injectable 05/20/2013, 09/17/2017, 05/21/2018   ??? PNEUMOCOCCAL POLYSACCHARIDE 23 05/20/2013       I have reviewed and (if needed) updated the patient's problem list, medications, allergies, past medical and surgical history, social and family history.    ROS:      ROS  Comprehensive 10 point ROS negative unless otherwise stated in the HPI.       Vital Signs:     Wt Readings from Last 3 Encounters:   06/18/18 62.8 kg (138 lb 6.4 oz)   05/21/18 62.5 kg (137 lb 11.2 oz)   12/25/17 63 kg (138 lb 14.4 oz)     Temp Readings from Last 3 Encounters:   05/21/18 36.8 ??C (98.2 ??F)   03/11/18  36.9 ??C (98.4 ??F) (Oral)   12/25/17 37 ??C (98.6 ??F)     BP Readings from Last 3 Encounters:   06/18/18 100/60   05/21/18 106/71   03/11/18 107/71     Pulse Readings from Last 3 Encounters:   06/18/18 64   05/21/18 60   03/11/18 60     Estimated body mass index is 18.68 kg/m?? as calculated from the following:    Height as of 05/21/18: 182.9 cm (6').    Weight as of 05/21/18: 62.5 kg (137 lb 11.2 oz).  No height and weight on file for this encounter.        Objective:      General: Thin, chronically ill-appearing 62??y.o.??male. In NAD. ??He moves slowly and uses a walker. ??HEENT:??The head is atraumatic and normocephalic. ??PERRLA. Conjunctivae pink. Sclerae anicteric.   ??Neck: Supple. No Palpable adenopathy. ??Full range of motion.  ??Lungs: LCTA. No wheeze, rhonchi or crackles. Normal WOB.  ??Heart: RRR. Normal S1/S2. No M/R/G.   ??Abdomen: Soft, nontender, nondistended without organomegaly. ??Normoactive bowel sounds.  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 ??He does not have his prosthesis in place. ??  Neurologic: Motor and sensation grossly intact. Cranial nerves II-XII Grossly intact.  ??Skin: No acute rashes are seen. No nonhealing lesions.????  Psych: ??Flat affect, appropriate to situation. Speech clear and coherent

## 2018-06-18 ENCOUNTER — Ambulatory Visit: Admit: 2018-06-18 | Discharge: 2018-06-19 | Payer: MEDICARE | Attending: Family | Primary: Family

## 2018-06-18 DIAGNOSIS — Z720 Tobacco use: Secondary | ICD-10-CM

## 2018-06-18 DIAGNOSIS — N529 Male erectile dysfunction, unspecified: Secondary | ICD-10-CM

## 2018-06-18 DIAGNOSIS — E785 Hyperlipidemia, unspecified: Secondary | ICD-10-CM

## 2018-06-18 DIAGNOSIS — E119 Type 2 diabetes mellitus without complications: Principal | ICD-10-CM

## 2018-06-18 MED ORDER — SILDENAFIL 25 MG TABLET
ORAL_TABLET | Freq: Every day | ORAL | 1 refills | 0.00000 days | Status: CP | PRN
Start: 2018-06-18 — End: 2018-07-18

## 2018-06-19 NOTE — Unmapped (Signed)
Follow-Up Counseling for HCV Treatment    Regimen: Harvoni x 12 weeks  Start Date: 01/15/18  Completion date: 04/08/18  Current Post-TW# 10    Pharmacy: [facility] [phone]     Following topics were reviewed during the phone call:    1. Any medical changes or hospitalizations since completing treatment? no    2. Any changes to current medications? no    3. Any alcohol use? Yes Pt reports he has drank a couple of beers since completing tx. Stressed the importance of remaining abstinent until we have assessed for cure.     4. Any barriers to coming to Post-TW#12 appointment? No Pt stated his daughter will bring him to his apt. Pt would like reminder call placed to his daughter Joni Reining.     5. Follow up - Has follow up appointment scheduled in HCV treatment clinic on 08/27/18 @ 11:30 am with Owens Shark, DNP. Stressed the importance of follow up 3 months post treatment to assess for cure. Informed patient that there is still a small chance of relapse after finishing the treatment. We discussed in the rare event that post-TW#12 lab results indicated relapse, pt will need to be re-treated.  We also discussed the importance of ongoing HCC screening.   All questions were answered.      [name] RN, [facility]   Pharmacy Adult GI Medicine  [facility]  [address]   [place], [place] [address]  [phone]    June 24, 2018 12:05 PM

## 2018-07-24 MED ORDER — PEG-ELECTROLYTE SOLUTION 420 GRAM ORAL SOLUTION
0 refills | 0 days | Status: CP
Start: 2018-07-24 — End: 2018-08-28

## 2018-08-04 ENCOUNTER — Ambulatory Visit: Admit: 2018-08-04 | Discharge: 2018-08-05

## 2018-08-04 ENCOUNTER — Ambulatory Visit: Admit: 2018-08-04 | Discharge: 2018-08-05 | Attending: Ophthalmology | Primary: Ophthalmology

## 2018-08-04 DIAGNOSIS — H401132 Primary open-angle glaucoma, bilateral, moderate stage: Principal | ICD-10-CM

## 2018-08-04 DIAGNOSIS — H2513 Age-related nuclear cataract, bilateral: Secondary | ICD-10-CM

## 2018-08-04 DIAGNOSIS — H401134 Primary open-angle glaucoma, bilateral, indeterminate stage: Secondary | ICD-10-CM

## 2018-08-04 DIAGNOSIS — H524 Presbyopia: Secondary | ICD-10-CM

## 2018-08-04 DIAGNOSIS — E119 Type 2 diabetes mellitus without complications: Secondary | ICD-10-CM

## 2018-08-04 MED ORDER — LATANOPROST 0.005 % EYE DROPS
Freq: Every evening | OPHTHALMIC | 11 refills | 0.00000 days | Status: CP
Start: 2018-08-04 — End: 2018-10-16

## 2018-08-28 MED ORDER — PEG-ELECTROLYTE SOLUTION 420 GRAM ORAL SOLUTION
0 refills | 0 days | Status: CP
Start: 2018-08-28 — End: 2018-09-04

## 2018-08-29 ENCOUNTER — Ambulatory Visit: Admit: 2018-08-29 | Discharge: 2018-08-29 | Payer: MEDICARE

## 2018-08-29 ENCOUNTER — Encounter: Admit: 2018-08-29 | Discharge: 2018-08-29 | Payer: MEDICARE | Attending: Anesthesiology | Primary: Anesthesiology

## 2018-08-29 DIAGNOSIS — Z1211 Encounter for screening for malignant neoplasm of colon: Principal | ICD-10-CM

## 2018-09-22 ENCOUNTER — Ambulatory Visit: Admit: 2018-09-22 | Discharge: 2018-09-23 | Payer: MEDICARE | Attending: Family | Primary: Family

## 2018-09-22 DIAGNOSIS — K740 Hepatic fibrosis: Principal | ICD-10-CM

## 2018-09-22 DIAGNOSIS — B182 Chronic viral hepatitis C: Secondary | ICD-10-CM

## 2018-10-01 ENCOUNTER — Ambulatory Visit: Admit: 2018-10-01 | Discharge: 2018-10-02 | Payer: MEDICARE

## 2018-10-01 DIAGNOSIS — K740 Hepatic fibrosis: Principal | ICD-10-CM

## 2018-10-01 DIAGNOSIS — B182 Chronic viral hepatitis C: Secondary | ICD-10-CM

## 2018-10-15 ENCOUNTER — Ambulatory Visit: Admit: 2018-10-15 | Discharge: 2018-10-16 | Payer: MEDICARE

## 2018-10-15 DIAGNOSIS — Z0000 Encounter for general adult medical examination without abnormal findings: Principal | ICD-10-CM

## 2018-10-16 MED ORDER — SIMVASTATIN 10 MG TABLET
ORAL_TABLET | Freq: Every evening | ORAL | 1 refills | 0.00000 days | Status: CP
Start: 2018-10-16 — End: 2019-10-17

## 2018-10-16 MED ORDER — LATANOPROST 0.005 % EYE DROPS
Freq: Every evening | OPHTHALMIC | 1 refills | 0 days | Status: CP
Start: 2018-10-16 — End: 2019-10-16

## 2018-10-16 MED ORDER — GLIPIZIDE 5 MG TABLET
ORAL_TABLET | Freq: Every day | ORAL | 1 refills | 0 days | Status: CP
Start: 2018-10-16 — End: 2019-10-17

## 2018-10-16 MED ORDER — CLOPIDOGREL 75 MG TABLET
ORAL_TABLET | Freq: Every day | ORAL | 1 refills | 0.00000 days | Status: CP
Start: 2018-10-16 — End: 2019-10-17

## 2018-10-16 MED ORDER — METFORMIN 1,000 MG TABLET
ORAL_TABLET | Freq: Two times a day (BID) | ORAL | 1 refills | 0.00000 days | Status: CP
Start: 2018-10-16 — End: 2019-10-17

## 2019-01-31 ENCOUNTER — Ambulatory Visit: Admit: 2019-01-31 | Discharge: 2019-02-01 | Payer: MEDICARE | Attending: Ophthalmology | Primary: Ophthalmology

## 2019-01-31 DIAGNOSIS — H409 Unspecified glaucoma: Principal | ICD-10-CM

## 2019-01-31 DIAGNOSIS — H2513 Age-related nuclear cataract, bilateral: Secondary | ICD-10-CM

## 2019-01-31 DIAGNOSIS — E119 Type 2 diabetes mellitus without complications: Secondary | ICD-10-CM

## 2019-01-31 DIAGNOSIS — H401134 Primary open-angle glaucoma, bilateral, indeterminate stage: Secondary | ICD-10-CM

## 2019-01-31 DIAGNOSIS — H524 Presbyopia: Secondary | ICD-10-CM

## 2019-03-30 ENCOUNTER — Ambulatory Visit: Admit: 2019-03-30 | Discharge: 2019-03-31 | Payer: MEDICARE | Attending: Family | Primary: Family

## 2019-03-30 DIAGNOSIS — Z720 Tobacco use: Secondary | ICD-10-CM

## 2019-03-30 DIAGNOSIS — Z8619 Personal history of other infectious and parasitic diseases: Secondary | ICD-10-CM

## 2019-03-30 DIAGNOSIS — R911 Solitary pulmonary nodule: Secondary | ICD-10-CM

## 2019-03-30 DIAGNOSIS — K740 Hepatic fibrosis: Principal | ICD-10-CM

## 2019-04-01 ENCOUNTER — Ambulatory Visit: Admit: 2019-04-01 | Discharge: 2019-04-01 | Payer: MEDICARE

## 2019-04-01 DIAGNOSIS — K740 Hepatic fibrosis: Principal | ICD-10-CM

## 2019-04-01 DIAGNOSIS — B182 Chronic viral hepatitis C: Secondary | ICD-10-CM

## 2019-05-27 ENCOUNTER — Ambulatory Visit: Admit: 2019-05-27 | Discharge: 2019-05-27 | Payer: MEDICARE

## 2019-05-27 DIAGNOSIS — Z720 Tobacco use: Secondary | ICD-10-CM

## 2019-05-27 DIAGNOSIS — R911 Solitary pulmonary nodule: Secondary | ICD-10-CM

## 2019-06-02 ENCOUNTER — Ambulatory Visit: Admit: 2019-06-02 | Discharge: 2019-06-03 | Payer: MEDICARE

## 2019-06-11 ENCOUNTER — Ambulatory Visit: Admit: 2019-06-11 | Discharge: 2019-06-12 | Payer: MEDICARE | Attending: Family | Primary: Family

## 2019-06-11 DIAGNOSIS — E785 Hyperlipidemia, unspecified: Principal | ICD-10-CM

## 2019-06-11 DIAGNOSIS — Z125 Encounter for screening for malignant neoplasm of prostate: Principal | ICD-10-CM

## 2019-06-11 DIAGNOSIS — Z23 Encounter for immunization: Principal | ICD-10-CM

## 2019-06-11 DIAGNOSIS — H401132 Primary open-angle glaucoma, bilateral, moderate stage: Principal | ICD-10-CM

## 2019-06-11 DIAGNOSIS — I739 Peripheral vascular disease, unspecified: Principal | ICD-10-CM

## 2019-06-11 DIAGNOSIS — E1159 Type 2 diabetes mellitus with other circulatory complications: Principal | ICD-10-CM

## 2019-06-11 DIAGNOSIS — K5900 Constipation, unspecified: Principal | ICD-10-CM

## 2019-06-11 MED ORDER — LATANOPROST 0.005 % EYE DROPS: 1 [drp] | mL | Freq: Every evening | 2 refills | 150 days | Status: AC

## 2019-06-11 MED ORDER — CLOPIDOGREL 75 MG TABLET: 75 mg | tablet | Freq: Every day | 1 refills | 90 days | Status: AC

## 2019-06-11 MED ORDER — SIMVASTATIN 20 MG TABLET: 20 mg | tablet | Freq: Every evening | 3 refills | 90 days | Status: AC

## 2019-06-11 MED ORDER — POLYETHYLENE GLYCOL 3350 17 GRAM ORAL POWDER PACKET: 17 g | packet | Freq: Every day | 0 refills | 30 days | Status: AC

## 2019-06-11 MED ORDER — BLOOD-GLUCOSE METER KIT WRAPPER: each | Freq: Once | 0 refills | 0 days | Status: AC

## 2019-11-30 ENCOUNTER — Ambulatory Visit: Admit: 2019-11-30 | Discharge: 2019-12-01 | Payer: MEDICARE

## 2019-11-30 DIAGNOSIS — H401134 Primary open-angle glaucoma, bilateral, indeterminate stage: Principal | ICD-10-CM

## 2019-11-30 MED ORDER — DORZOLAMIDE 22.3 MG-TIMOLOL 6.8 MG/ML EYE DROPS
Freq: Two times a day (BID) | OPHTHALMIC | 11 refills | 50 days | Status: CP
Start: 2019-11-30 — End: 2019-12-30

## 2019-12-09 ENCOUNTER — Ambulatory Visit: Admit: 2019-12-09 | Discharge: 2019-12-09 | Payer: MEDICARE | Attending: Family | Primary: Family

## 2019-12-09 ENCOUNTER — Institutional Professional Consult (permissible substitution): Admit: 2019-12-09 | Discharge: 2019-12-09 | Payer: MEDICARE

## 2019-12-09 DIAGNOSIS — R911 Solitary pulmonary nodule: Principal | ICD-10-CM

## 2019-12-09 DIAGNOSIS — Z720 Tobacco use: Principal | ICD-10-CM

## 2019-12-09 DIAGNOSIS — Z8619 Personal history of other infectious and parasitic diseases: Principal | ICD-10-CM

## 2019-12-09 DIAGNOSIS — Z0000 Encounter for general adult medical examination without abnormal findings: Principal | ICD-10-CM

## 2019-12-09 DIAGNOSIS — K74 Fibrosis of liver: Principal | ICD-10-CM

## 2019-12-09 DIAGNOSIS — Z1321 Encounter for screening for nutritional disorder: Principal | ICD-10-CM

## 2019-12-10 DIAGNOSIS — Z899 Acquired absence of limb, unspecified: Principal | ICD-10-CM

## 2019-12-10 DIAGNOSIS — E1159 Type 2 diabetes mellitus with other circulatory complications: Principal | ICD-10-CM

## 2019-12-10 DIAGNOSIS — Z23 Encounter for immunization: Principal | ICD-10-CM

## 2019-12-10 DIAGNOSIS — I739 Peripheral vascular disease, unspecified: Principal | ICD-10-CM

## 2019-12-10 DIAGNOSIS — E785 Hyperlipidemia, unspecified: Principal | ICD-10-CM

## 2019-12-10 MED ORDER — CLOPIDOGREL 75 MG TABLET
ORAL_TABLET | Freq: Every day | ORAL | 3 refills | 90 days | Status: CP
Start: 2019-12-10 — End: 2020-12-10

## 2019-12-11 DIAGNOSIS — E559 Vitamin D deficiency, unspecified: Principal | ICD-10-CM

## 2019-12-11 MED ORDER — ERGOCALCIFEROL (VITAMIN D2) 1,250 MCG (50,000 UNIT) CAPSULE
ORAL_CAPSULE | ORAL | 2 refills | 84.00000 days | Status: CP
Start: 2019-12-11 — End: ?

## 2019-12-22 DIAGNOSIS — Z899 Acquired absence of limb, unspecified: Principal | ICD-10-CM

## 2019-12-23 ENCOUNTER — Ambulatory Visit: Admit: 2019-12-23 | Discharge: 2019-12-24 | Payer: MEDICARE

## 2020-01-08 ENCOUNTER — Ambulatory Visit: Admit: 2020-01-08 | Discharge: 2020-02-06 | Payer: MEDICARE

## 2020-02-01 ENCOUNTER — Ambulatory Visit: Admit: 2020-02-01 | Discharge: 2020-02-02 | Payer: MEDICARE

## 2020-02-01 DIAGNOSIS — H401133 Primary open-angle glaucoma, bilateral, severe stage: Principal | ICD-10-CM

## 2020-03-28 DIAGNOSIS — Z978 Presence of other specified devices: Principal | ICD-10-CM

## 2020-03-28 DIAGNOSIS — Z899 Acquired absence of limb, unspecified: Principal | ICD-10-CM

## 2020-05-27 ENCOUNTER — Ambulatory Visit: Admit: 2020-05-27 | Discharge: 2020-05-28 | Payer: MEDICARE | Attending: Family | Primary: Family

## 2020-05-27 DIAGNOSIS — K5900 Constipation, unspecified: Principal | ICD-10-CM

## 2020-05-27 DIAGNOSIS — M5442 Lumbago with sciatica, left side: Principal | ICD-10-CM

## 2020-05-27 DIAGNOSIS — K5909 Other constipation: Principal | ICD-10-CM

## 2020-05-27 MED ORDER — IBUPROFEN 600 MG TABLET
ORAL_TABLET | Freq: Four times a day (QID) | ORAL | 2 refills | 15.00000 days | Status: CP | PRN
Start: 2020-05-27 — End: 2021-05-27

## 2020-05-27 MED ORDER — POLYETHYLENE GLYCOL 3350 17 GRAM ORAL POWDER PACKET
PACK | Freq: Two times a day (BID) | ORAL | 0 refills | 15.00000 days | Status: CP
Start: 2020-05-27 — End: ?

## 2020-05-27 MED ORDER — MAGNESIUM HYDROXIDE 400 MG/5 ML ORAL SUSPENSION
Freq: Every day | ORAL | 3 refills | 12.00000 days | Status: CP | PRN
Start: 2020-05-27 — End: 2021-05-27

## 2020-06-27 ENCOUNTER — Ambulatory Visit: Admit: 2020-06-27 | Discharge: 2020-07-26 | Payer: MEDICARE

## 2020-06-27 ENCOUNTER — Ambulatory Visit
Admit: 2020-06-27 | Discharge: 2020-07-26 | Payer: MEDICARE | Attending: Student in an Organized Health Care Education/Training Program | Primary: Student in an Organized Health Care Education/Training Program

## 2020-06-27 DIAGNOSIS — Z899 Acquired absence of limb, unspecified: Principal | ICD-10-CM

## 2020-06-27 DIAGNOSIS — Z7409 Other reduced mobility: Principal | ICD-10-CM

## 2020-06-27 DIAGNOSIS — I739 Peripheral vascular disease, unspecified: Principal | ICD-10-CM

## 2020-06-27 DIAGNOSIS — Z89619 Acquired absence of unspecified leg above knee: Principal | ICD-10-CM

## 2020-06-27 DIAGNOSIS — Z978 Presence of other specified devices: Principal | ICD-10-CM

## 2020-06-27 DIAGNOSIS — R29898 Other symptoms and signs involving the musculoskeletal system: Principal | ICD-10-CM

## 2020-06-27 DIAGNOSIS — Z9714 Presence of artificial left leg (complete) (partial): Principal | ICD-10-CM

## 2020-06-27 DIAGNOSIS — E119 Type 2 diabetes mellitus without complications: Principal | ICD-10-CM

## 2020-06-27 DIAGNOSIS — Z89612 Acquired absence of left leg above knee: Principal | ICD-10-CM

## 2020-06-27 DIAGNOSIS — R2689 Other abnormalities of gait and mobility: Principal | ICD-10-CM

## 2020-06-27 DIAGNOSIS — E1151 Type 2 diabetes mellitus with diabetic peripheral angiopathy without gangrene: Principal | ICD-10-CM

## 2020-07-08 DIAGNOSIS — E1151 Type 2 diabetes mellitus with diabetic peripheral angiopathy without gangrene: Principal | ICD-10-CM

## 2020-07-08 DIAGNOSIS — Z978 Presence of other specified devices: Principal | ICD-10-CM

## 2020-07-08 DIAGNOSIS — R29898 Other symptoms and signs involving the musculoskeletal system: Principal | ICD-10-CM

## 2020-07-08 DIAGNOSIS — Z9714 Presence of artificial left leg (complete) (partial): Principal | ICD-10-CM

## 2020-07-08 DIAGNOSIS — Z7409 Other reduced mobility: Principal | ICD-10-CM

## 2020-07-08 DIAGNOSIS — R2689 Other abnormalities of gait and mobility: Principal | ICD-10-CM

## 2020-07-08 DIAGNOSIS — Z89612 Acquired absence of left leg above knee: Principal | ICD-10-CM

## 2020-07-08 DIAGNOSIS — Z899 Acquired absence of limb, unspecified: Principal | ICD-10-CM

## 2020-07-22 DIAGNOSIS — Z7409 Other reduced mobility: Principal | ICD-10-CM

## 2020-07-22 DIAGNOSIS — Z9714 Presence of artificial left leg (complete) (partial): Principal | ICD-10-CM

## 2020-07-22 DIAGNOSIS — E119 Type 2 diabetes mellitus without complications: Principal | ICD-10-CM

## 2020-07-22 DIAGNOSIS — R2689 Other abnormalities of gait and mobility: Principal | ICD-10-CM

## 2020-07-22 DIAGNOSIS — Z89619 Acquired absence of unspecified leg above knee: Principal | ICD-10-CM

## 2020-07-22 DIAGNOSIS — I739 Peripheral vascular disease, unspecified: Principal | ICD-10-CM

## 2020-07-22 DIAGNOSIS — E1151 Type 2 diabetes mellitus with diabetic peripheral angiopathy without gangrene: Principal | ICD-10-CM

## 2020-07-22 DIAGNOSIS — Z89612 Acquired absence of left leg above knee: Principal | ICD-10-CM

## 2020-07-22 DIAGNOSIS — Z978 Presence of other specified devices: Principal | ICD-10-CM

## 2020-07-22 DIAGNOSIS — R29898 Other symptoms and signs involving the musculoskeletal system: Principal | ICD-10-CM

## 2020-07-22 DIAGNOSIS — Z899 Acquired absence of limb, unspecified: Principal | ICD-10-CM

## 2020-07-26 DIAGNOSIS — Z89619 Acquired absence of unspecified leg above knee: Principal | ICD-10-CM

## 2020-07-26 DIAGNOSIS — Z7409 Other reduced mobility: Principal | ICD-10-CM

## 2020-07-29 ENCOUNTER — Ambulatory Visit: Admit: 2020-07-29 | Discharge: 2020-08-25 | Payer: MEDICARE

## 2020-07-29 DIAGNOSIS — I739 Peripheral vascular disease, unspecified: Principal | ICD-10-CM

## 2020-07-29 DIAGNOSIS — Z89612 Acquired absence of left leg above knee: Principal | ICD-10-CM

## 2020-07-29 DIAGNOSIS — R2689 Other abnormalities of gait and mobility: Principal | ICD-10-CM

## 2020-07-29 DIAGNOSIS — R29898 Other symptoms and signs involving the musculoskeletal system: Principal | ICD-10-CM

## 2020-07-29 DIAGNOSIS — Z9714 Presence of artificial left leg (complete) (partial): Principal | ICD-10-CM

## 2020-07-29 DIAGNOSIS — E785 Hyperlipidemia, unspecified: Principal | ICD-10-CM

## 2020-07-29 DIAGNOSIS — E119 Type 2 diabetes mellitus without complications: Principal | ICD-10-CM

## 2020-07-29 DIAGNOSIS — Z89619 Acquired absence of unspecified leg above knee: Principal | ICD-10-CM

## 2020-07-29 DIAGNOSIS — Z7409 Other reduced mobility: Principal | ICD-10-CM

## 2020-07-29 DIAGNOSIS — Z899 Acquired absence of limb, unspecified: Principal | ICD-10-CM

## 2020-07-29 DIAGNOSIS — E1151 Type 2 diabetes mellitus with diabetic peripheral angiopathy without gangrene: Principal | ICD-10-CM

## 2020-07-29 DIAGNOSIS — Z978 Presence of other specified devices: Principal | ICD-10-CM

## 2020-07-29 MED ORDER — SIMVASTATIN 20 MG TABLET: 20 mg | tablet | Freq: Every evening | 3 refills | 90 days | Status: AC

## 2020-07-29 MED ORDER — SIMVASTATIN 20 MG TABLET
ORAL_TABLET | Freq: Every evening | ORAL | 3 refills | 90.00000 days | Status: CP
Start: 2020-07-29 — End: 2020-07-29

## 2020-08-05 DIAGNOSIS — E1151 Type 2 diabetes mellitus with diabetic peripheral angiopathy without gangrene: Principal | ICD-10-CM

## 2020-08-05 DIAGNOSIS — Z89619 Acquired absence of unspecified leg above knee: Principal | ICD-10-CM

## 2020-08-05 DIAGNOSIS — Z9714 Presence of artificial left leg (complete) (partial): Principal | ICD-10-CM

## 2020-08-05 DIAGNOSIS — Z7409 Other reduced mobility: Principal | ICD-10-CM

## 2020-08-05 DIAGNOSIS — R2689 Other abnormalities of gait and mobility: Principal | ICD-10-CM

## 2020-08-05 DIAGNOSIS — I739 Peripheral vascular disease, unspecified: Principal | ICD-10-CM

## 2020-08-05 DIAGNOSIS — Z978 Presence of other specified devices: Principal | ICD-10-CM

## 2020-08-05 DIAGNOSIS — R29898 Other symptoms and signs involving the musculoskeletal system: Principal | ICD-10-CM

## 2020-08-05 DIAGNOSIS — Z89612 Acquired absence of left leg above knee: Principal | ICD-10-CM

## 2020-08-05 DIAGNOSIS — Z899 Acquired absence of limb, unspecified: Principal | ICD-10-CM

## 2020-08-05 DIAGNOSIS — E119 Type 2 diabetes mellitus without complications: Principal | ICD-10-CM

## 2020-09-02 ENCOUNTER — Ambulatory Visit: Admit: 2020-09-02 | Discharge: 2020-09-24 | Payer: MEDICARE

## 2020-09-02 DIAGNOSIS — Z89619 Acquired absence of unspecified leg above knee: Principal | ICD-10-CM

## 2020-09-02 DIAGNOSIS — I739 Peripheral vascular disease, unspecified: Principal | ICD-10-CM

## 2020-09-02 DIAGNOSIS — Z89612 Acquired absence of left leg above knee: Principal | ICD-10-CM

## 2020-09-02 DIAGNOSIS — R29898 Other symptoms and signs involving the musculoskeletal system: Principal | ICD-10-CM

## 2020-09-02 DIAGNOSIS — Z899 Acquired absence of limb, unspecified: Principal | ICD-10-CM

## 2020-09-02 DIAGNOSIS — R2689 Other abnormalities of gait and mobility: Principal | ICD-10-CM

## 2020-09-02 DIAGNOSIS — Z978 Presence of other specified devices: Principal | ICD-10-CM

## 2020-09-02 DIAGNOSIS — Z9714 Presence of artificial left leg (complete) (partial): Principal | ICD-10-CM

## 2020-09-02 DIAGNOSIS — E119 Type 2 diabetes mellitus without complications: Principal | ICD-10-CM

## 2020-09-02 DIAGNOSIS — Z7409 Other reduced mobility: Principal | ICD-10-CM

## 2020-09-02 DIAGNOSIS — E1151 Type 2 diabetes mellitus with diabetic peripheral angiopathy without gangrene: Principal | ICD-10-CM

## 2021-01-13 DIAGNOSIS — I739 Peripheral vascular disease, unspecified: Principal | ICD-10-CM

## 2021-01-13 MED ORDER — CLOPIDOGREL 75 MG TABLET
ORAL_TABLET | Freq: Every day | ORAL | 1 refills | 90 days | Status: CP
Start: 2021-01-13 — End: 2022-01-13

## 2021-02-09 ENCOUNTER — Ambulatory Visit: Admit: 2021-02-09 | Discharge: 2021-02-10 | Payer: MEDICARE

## 2021-02-09 DIAGNOSIS — W57XXXA Bitten or stung by nonvenomous insect and other nonvenomous arthropods, initial encounter: Principal | ICD-10-CM

## 2021-02-09 MED ORDER — TRIAMCINOLONE ACETONIDE 0.1 % TOPICAL OINTMENT
Freq: Two times a day (BID) | TOPICAL | 1 refills | 0.00000 days | Status: CP
Start: 2021-02-09 — End: 2022-02-09

## 2021-02-17 ENCOUNTER — Ambulatory Visit: Admit: 2021-02-17 | Discharge: 2021-02-18 | Payer: MEDICARE | Attending: Family | Primary: Family

## 2021-02-17 DIAGNOSIS — M542 Cervicalgia: Principal | ICD-10-CM

## 2021-02-17 DIAGNOSIS — E785 Hyperlipidemia, unspecified: Principal | ICD-10-CM

## 2021-02-17 DIAGNOSIS — E559 Vitamin D deficiency, unspecified: Principal | ICD-10-CM

## 2021-02-17 DIAGNOSIS — E1159 Type 2 diabetes mellitus with other circulatory complications: Principal | ICD-10-CM

## 2021-02-17 DIAGNOSIS — Z23 Encounter for immunization: Principal | ICD-10-CM

## 2021-02-17 MED ORDER — ERGOCALCIFEROL (VITAMIN D2) 1,250 MCG (50,000 UNIT) CAPSULE
ORAL_CAPSULE | ORAL | 2 refills | 84 days | Status: CP
Start: 2021-02-17 — End: 2022-02-17

## 2021-02-17 MED ORDER — GLIPIZIDE 5 MG TABLET
ORAL_TABLET | Freq: Every day | ORAL | 1 refills | 90 days | Status: CP
Start: 2021-02-17 — End: 2022-02-18

## 2021-02-17 MED ORDER — CYCLOBENZAPRINE 5 MG TABLET
ORAL_TABLET | 1 refills | 0 days | Status: CP
Start: 2021-02-17 — End: ?

## 2021-02-27 DIAGNOSIS — E559 Vitamin D deficiency, unspecified: Principal | ICD-10-CM

## 2021-02-27 MED ORDER — ERGOCALCIFEROL (VITAMIN D2) 1,250 MCG (50,000 UNIT) CAPSULE
ORAL_CAPSULE | ORAL | 0 refills | 56 days | Status: CP
Start: 2021-02-27 — End: 2022-02-27

## 2021-04-27 ENCOUNTER — Institutional Professional Consult (permissible substitution): Admit: 2021-04-27 | Discharge: 2021-04-27 | Payer: MEDICARE

## 2021-04-27 DIAGNOSIS — Z596 Low income: Principal | ICD-10-CM

## 2021-04-27 DIAGNOSIS — Z0000 Encounter for general adult medical examination without abnormal findings: Principal | ICD-10-CM

## 2021-09-08 ENCOUNTER — Ambulatory Visit: Admit: 2021-09-08 | Discharge: 2021-09-09 | Payer: MEDICARE | Attending: Family | Primary: Family

## 2021-09-08 DIAGNOSIS — E785 Hyperlipidemia, unspecified: Principal | ICD-10-CM

## 2021-09-08 DIAGNOSIS — E559 Vitamin D deficiency, unspecified: Principal | ICD-10-CM

## 2021-09-08 DIAGNOSIS — E1159 Type 2 diabetes mellitus with other circulatory complications: Principal | ICD-10-CM

## 2021-09-08 MED ORDER — SIMVASTATIN 20 MG TABLET
ORAL_TABLET | Freq: Every evening | ORAL | 3 refills | 90 days | Status: CP
Start: 2021-09-08 — End: 2022-09-08

## 2021-11-12 DIAGNOSIS — I739 Peripheral vascular disease, unspecified: Principal | ICD-10-CM

## 2021-11-12 MED ORDER — CLOPIDOGREL 75 MG TABLET
ORAL_TABLET | Freq: Every day | ORAL | 1 refills | 90 days | Status: CP
Start: 2021-11-12 — End: 2022-11-13

## 2022-03-16 ENCOUNTER — Ambulatory Visit: Admit: 2022-03-16 | Discharge: 2022-03-17 | Payer: MEDICARE | Attending: Family | Primary: Family

## 2022-03-16 DIAGNOSIS — E1159 Type 2 diabetes mellitus with other circulatory complications: Principal | ICD-10-CM

## 2022-03-16 DIAGNOSIS — I739 Peripheral vascular disease, unspecified: Principal | ICD-10-CM

## 2022-03-16 DIAGNOSIS — Z136 Encounter for screening for cardiovascular disorders: Principal | ICD-10-CM

## 2022-03-16 DIAGNOSIS — E785 Hyperlipidemia, unspecified: Principal | ICD-10-CM

## 2022-05-23 ENCOUNTER — Ambulatory Visit: Admit: 2022-05-23 | Discharge: 2022-05-24 | Payer: MEDICARE

## 2022-06-24 ENCOUNTER — Emergency Department
Admission: EM | Admit: 2022-06-24 | Discharge: 2022-07-20 | Disposition: E | Payer: Commercial Managed Care - HMO | Attending: Emergency Medicine | Admitting: Emergency Medicine

## 2022-06-24 DIAGNOSIS — I469 Cardiac arrest, cause unspecified: Secondary | ICD-10-CM | POA: Diagnosis not present

## 2022-06-24 DIAGNOSIS — Z794 Long term (current) use of insulin: Secondary | ICD-10-CM | POA: Insufficient documentation

## 2022-06-24 DIAGNOSIS — E119 Type 2 diabetes mellitus without complications: Secondary | ICD-10-CM | POA: Diagnosis not present

## 2022-06-24 MED ORDER — EPINEPHRINE 1 MG/10ML IJ SOSY
PREFILLED_SYRINGE | INTRAMUSCULAR | Status: AC | PRN
  Administered 2022-06-24: 1 mg via INTRAVENOUS

## 2022-06-24 MED ORDER — SODIUM BICARBONATE 8.4 % IV SOLN
INTRAVENOUS | Status: AC | PRN
  Administered 2022-06-24: 50 meq via INTRAVENOUS

## 2022-06-24 MED ORDER — EPINEPHRINE 1 MG/10ML IJ SOSY
PREFILLED_SYRINGE | INTRAMUSCULAR | Status: AC | PRN
  Administered 2022-06-24 (×4): 1 mg via INTRAVENOUS

## 2022-06-24 MED ORDER — SODIUM CHLORIDE 0.9 % IV SOLN
INTRAVENOUS | Status: AC | PRN
  Administered 2022-06-24: 1000 mL via INTRAVENOUS

## 2022-07-20 NOTE — Progress Notes (Addendum)
Chaplain provided emotional and grief support to family following CPR in progress and death.  NOK info is provided below.  Family does not have [place] information at this time. Chaplain provided Patient Placement card; family will call with information.  Chaplain assisted in securing NOK signature for funeral home release.    Minus [place], [place] Pager:  [phone]  Next of kin: Daughter, Bruce Shannon [address] [place], [place] [address]  Cell:  [phone]     07/10/22 1400  Clinical Encounter Type  Visited With Patient;Family  Visit Type Critical Care;Death  Referral From Nurse  Consult/Referral To Chaplain  Spiritual Encounters  Spiritual Needs Grief support  Stress Factors  Family Stress Factors Loss

## 2022-07-20 NOTE — Code Documentation (Signed)
CPR stopped. No pulses. No cardiac activity on the [place].   Time of Death 1353-11-25 called by Dr. Starleen Blue

## 2022-07-20 NOTE — Code Documentation (Signed)
V fib on the monitor. Pt shocked at [place].

## 2022-07-20 NOTE — ED Provider Notes (Signed)
[facility] Provider Note    Event Date/Time   First MD Initiated Contact with Patient June 26, 2022 1358     (approximate)   History   Cardiac Arrest   HPI  Bruce Shannon is a 66 y.o. male  with pmh diabetes on insulin, AAA, peripheral vascular disease who presents after cardiac arrest.  Per EMS they report out for patient being unresponsive.  When they arrived he was responsive but was quite somnolent and his pulse ox was in the 70s and he was bradycardic with heart rates in the 50s.  Initial EKG showed some ST depression in the anterior precordial leads.  Patient subsequently had a respiratory arrest on route bradycardia down and then went to cardiac arrest.  Initial rhythm was PEA he received 3 rounds of epinephrine and then went into V-fib and was shocked several times.  Received amiodarone 150 then 300 mg.  They did not achieve ROSC.  Blood sugar was elevated.     No past medical history on file.  There are no problems to display for this patient.    Physical Exam  Triage Vital Signs: ED Triage Vitals  Enc Vitals Group     BP      Pulse      Resp      Temp      Temp src      SpO2      Weight      Height      Head Circumference      Peak Flow      Pain Score      Pain Loc      Pain Edu?      Excl. in [place]?     Most recent vital signs: There were no vitals filed for this visit.   General: Intubated critically ill-appearing.  CV:  Left AKA Resp:  No spontaneous respiratory effort, i-gel in place Abd:  No distention.  Neuro:             GCS 3 no gag Other:     ED Results / Procedures / Treatments  Labs (all labs ordered are listed, but only abnormal results are displayed) Labs Reviewed - No data to display   EKG     RADIOLOGY    PROCEDURES:  Critical Care performed: No  .Critical Care  Performed by: Rada Hay, MD Authorized by: Rada Hay, MD   Critical care provider statement:    Critical care  time (minutes):  30   Critical care was time spent personally by me on the following activities:  Development of treatment plan with patient or surrogate, discussions with consultants, evaluation of patient's response to treatment, examination of patient, ordering and review of laboratory studies, ordering and review of radiographic studies, ordering and performing treatments and interventions, pulse oximetry, re-evaluation of patient's condition and review of old charts Procedure Name: Intubation Date/Time: 06/26/2022 3:27 PM  Performed by: Rada Hay, MDPre-anesthesia Checklist: Patient identified, Patient being monitored, Emergency Drugs available, Timeout performed and Suction available Oxygen Delivery Method: Non-rebreather mask Preoxygenation: Pre-oxygenation with 100% oxygen Induction Type: Rapid sequence Ventilation: Mask ventilation without difficulty Grade View: Grade I Tube size: 7.5 mm Number of attempts: 1 Placement Confirmation: ETT inserted through vocal cords under direct vision, CO2 detector and Breath sounds checked- equal and bilateral Secured at: 26 cm Tube secured with: ETT holder      The patient is on the cardiac monitor to evaluate for evidence  of arrhythmia and/or significant heart rate changes.   MEDICATIONS ORDERED IN ED: Medications  EPINEPHrine (ADRENALIN) 1 MG/10ML injection (1 mg Intravenous Given 07/21/2022 1350)  0.9 %  sodium chloride infusion (0 mLs Intravenous Stopped 21-Jul-2022 1357)  EPINEPHrine (ADRENALIN) 1 MG/10ML injection (1 mg Intravenous Given 07/21/2022 1345)  sodium bicarbonate injection (50 mEq Intravenous Given 07-21-2022 1348)     IMPRESSION / MDM / ASSESSMENT AND PLAN / ED COURSE  I reviewed the triage vital signs and the nursing notes.                              Patient's presentation is most consistent with acute presentation with potential threat to life or bodily function.  Differential diagnosis includes, but is not limited  to, acute coronary syndrome, pulmonary embolism, respiratory arrest from CHF or pneumonia, pneumothorax, acidosis, hypokalemia, hyperkalemia  The patient is a 66 year old male with multiple medical comorbidities presenting in cardiac arrest.  Initially was responsive for EMS but hypoxic bradycardic and bradycardia down and had a respiratory arrest on route.  Initial rhythm was PEA received 3 of epinephrine and then went into V. tach/V-fib was shocked 3 times.  Initial rhythm in the ED is V-fib he was shocked x1.  Received several additional doses of epinephrine.  Next rhythm was asystole then PEA.  Bedside ultrasound performed and confirms that there is lung sliding bilaterally he has no cardiac activity.  I spoke with patient's family they ultimately do not want him to be a vegetable or be on a ventilator unresponsive.  After patient was then again asystolic code was called at 1:55 PM.  Spoke to ME he would not be ME case.       FINAL CLINICAL IMPRESSION(S) / ED DIAGNOSES   Final diagnoses:  Cardiac arrest [name])     Rx / DC Orders   ED Discharge Orders     None        Note:  This document was prepared using Dragon voice recognition software and may include unintentional dictation errors.   Georga Hacking, MD July 21, 2022 [id number]

## 2022-07-20 DEATH — deceased
